# Patient Record
Sex: Male | Born: 1955 | Race: White | Hispanic: No | Marital: Married | Smoking: Current every day smoker
Health system: Southern US, Community
[De-identification: ages and names within clinical notes are randomized; demographics above are authoritative.]

## PROBLEM LIST (undated history)

## (undated) DIAGNOSIS — I10 Essential (primary) hypertension: Secondary | ICD-10-CM

## (undated) DIAGNOSIS — C801 Malignant (primary) neoplasm, unspecified: Secondary | ICD-10-CM

## (undated) DIAGNOSIS — C679 Malignant neoplasm of bladder, unspecified: Secondary | ICD-10-CM

## (undated) DIAGNOSIS — C61 Malignant neoplasm of prostate: Secondary | ICD-10-CM

## (undated) HISTORY — PX: LIVER TRANSPLANT: SHX410

## (undated) HISTORY — PX: NEPHRECTOMY: SHX65

## (undated) HISTORY — PX: PROSTATE BIOPSY: SHX241

---

## 2009-01-31 ENCOUNTER — Encounter: Admission: RE | Admit: 2009-01-31 | Discharge: 2009-01-31 | Payer: Self-pay | Admitting: Orthopedic Surgery

## 2009-02-16 ENCOUNTER — Encounter (INDEPENDENT_AMBULATORY_CARE_PROVIDER_SITE_OTHER): Payer: Self-pay | Admitting: Orthopedic Surgery

## 2009-02-16 ENCOUNTER — Inpatient Hospital Stay (HOSPITAL_COMMUNITY): Admission: RE | Admit: 2009-02-16 | Discharge: 2009-02-18 | Payer: Self-pay | Admitting: Orthopedic Surgery

## 2009-11-29 ENCOUNTER — Ambulatory Visit (HOSPITAL_COMMUNITY): Admission: RE | Admit: 2009-11-29 | Discharge: 2009-11-29 | Payer: Self-pay | Admitting: Family Medicine

## 2009-12-01 ENCOUNTER — Ambulatory Visit (HOSPITAL_COMMUNITY): Admission: RE | Admit: 2009-12-01 | Discharge: 2009-12-01 | Payer: Self-pay | Admitting: Family Medicine

## 2009-12-24 ENCOUNTER — Emergency Department (HOSPITAL_COMMUNITY): Admission: EM | Admit: 2009-12-24 | Discharge: 2009-12-24 | Payer: Self-pay | Admitting: Emergency Medicine

## 2009-12-26 ENCOUNTER — Inpatient Hospital Stay (HOSPITAL_COMMUNITY): Admission: EM | Admit: 2009-12-26 | Discharge: 2009-12-27 | Payer: Self-pay | Admitting: Emergency Medicine

## 2010-01-18 ENCOUNTER — Ambulatory Visit (HOSPITAL_COMMUNITY): Admission: RE | Admit: 2010-01-18 | Discharge: 2010-01-18 | Payer: Self-pay | Admitting: Family Medicine

## 2010-02-25 ENCOUNTER — Inpatient Hospital Stay (HOSPITAL_COMMUNITY): Admission: EM | Admit: 2010-02-25 | Discharge: 2010-02-27 | Payer: Self-pay | Admitting: Emergency Medicine

## 2010-03-08 ENCOUNTER — Ambulatory Visit (HOSPITAL_COMMUNITY): Admission: RE | Admit: 2010-03-08 | Discharge: 2010-03-08 | Payer: Self-pay | Admitting: Family Medicine

## 2010-03-10 IMAGING — CR DG OR PORTABLE SPINE
1 series · 1 of 1 positions shown · non-contrast
Comparison: 6288 hours

CLINICAL DATA: Spinal stenosis - intraoperative localizing image

PORTABLE SPINE

[view not recorded]
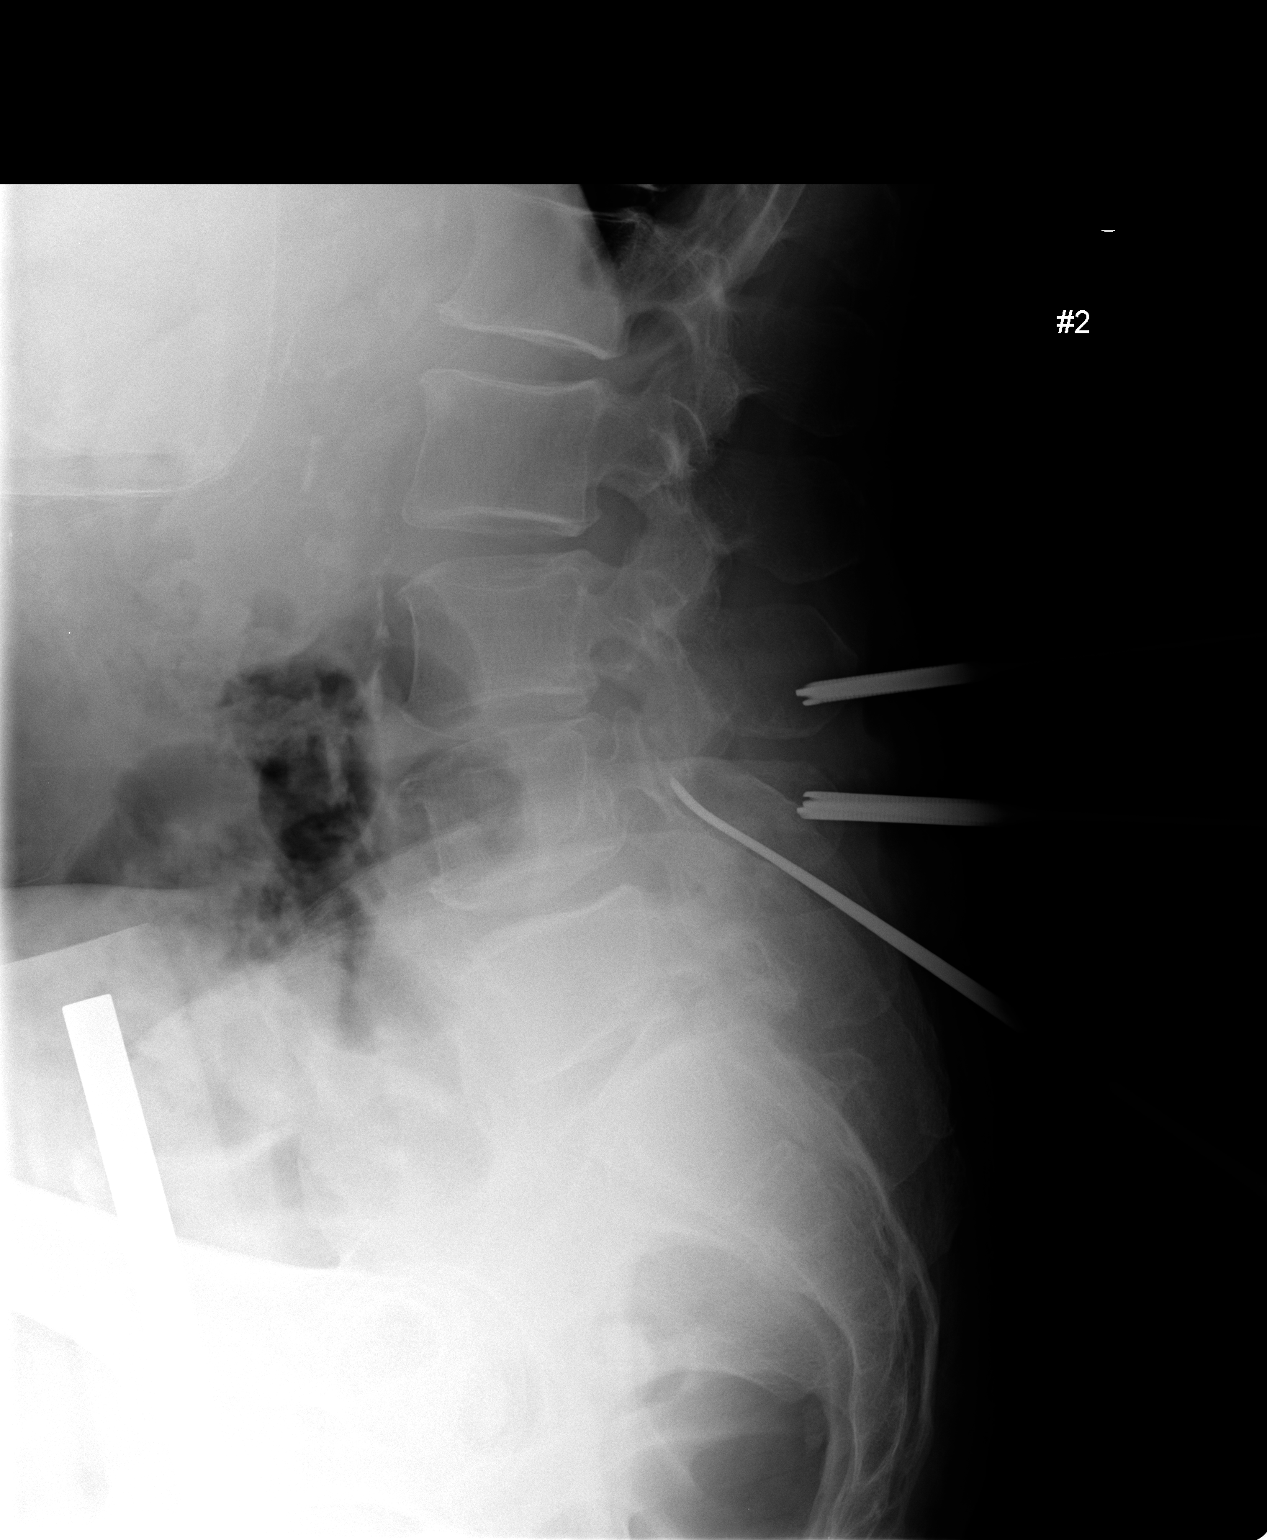

[1 of 1 positions shown; findings below may reference images not displayed]

FINDINGS: Image #2 at 9084 hours, shows an instrument posterior to
the neural canal at the level of the L4 pedicle.
IMPRESSION: An instrument localizes the level of the pedicles of L4.

## 2010-03-10 IMAGING — CR DG OR PORTABLE SPINE
1 series · 1 of 1 positions shown · non-contrast
Comparison: 3822 hours

CLINICAL DATA: Localization for spinal stenosis

PORTABLE SPINE

[view not recorded]
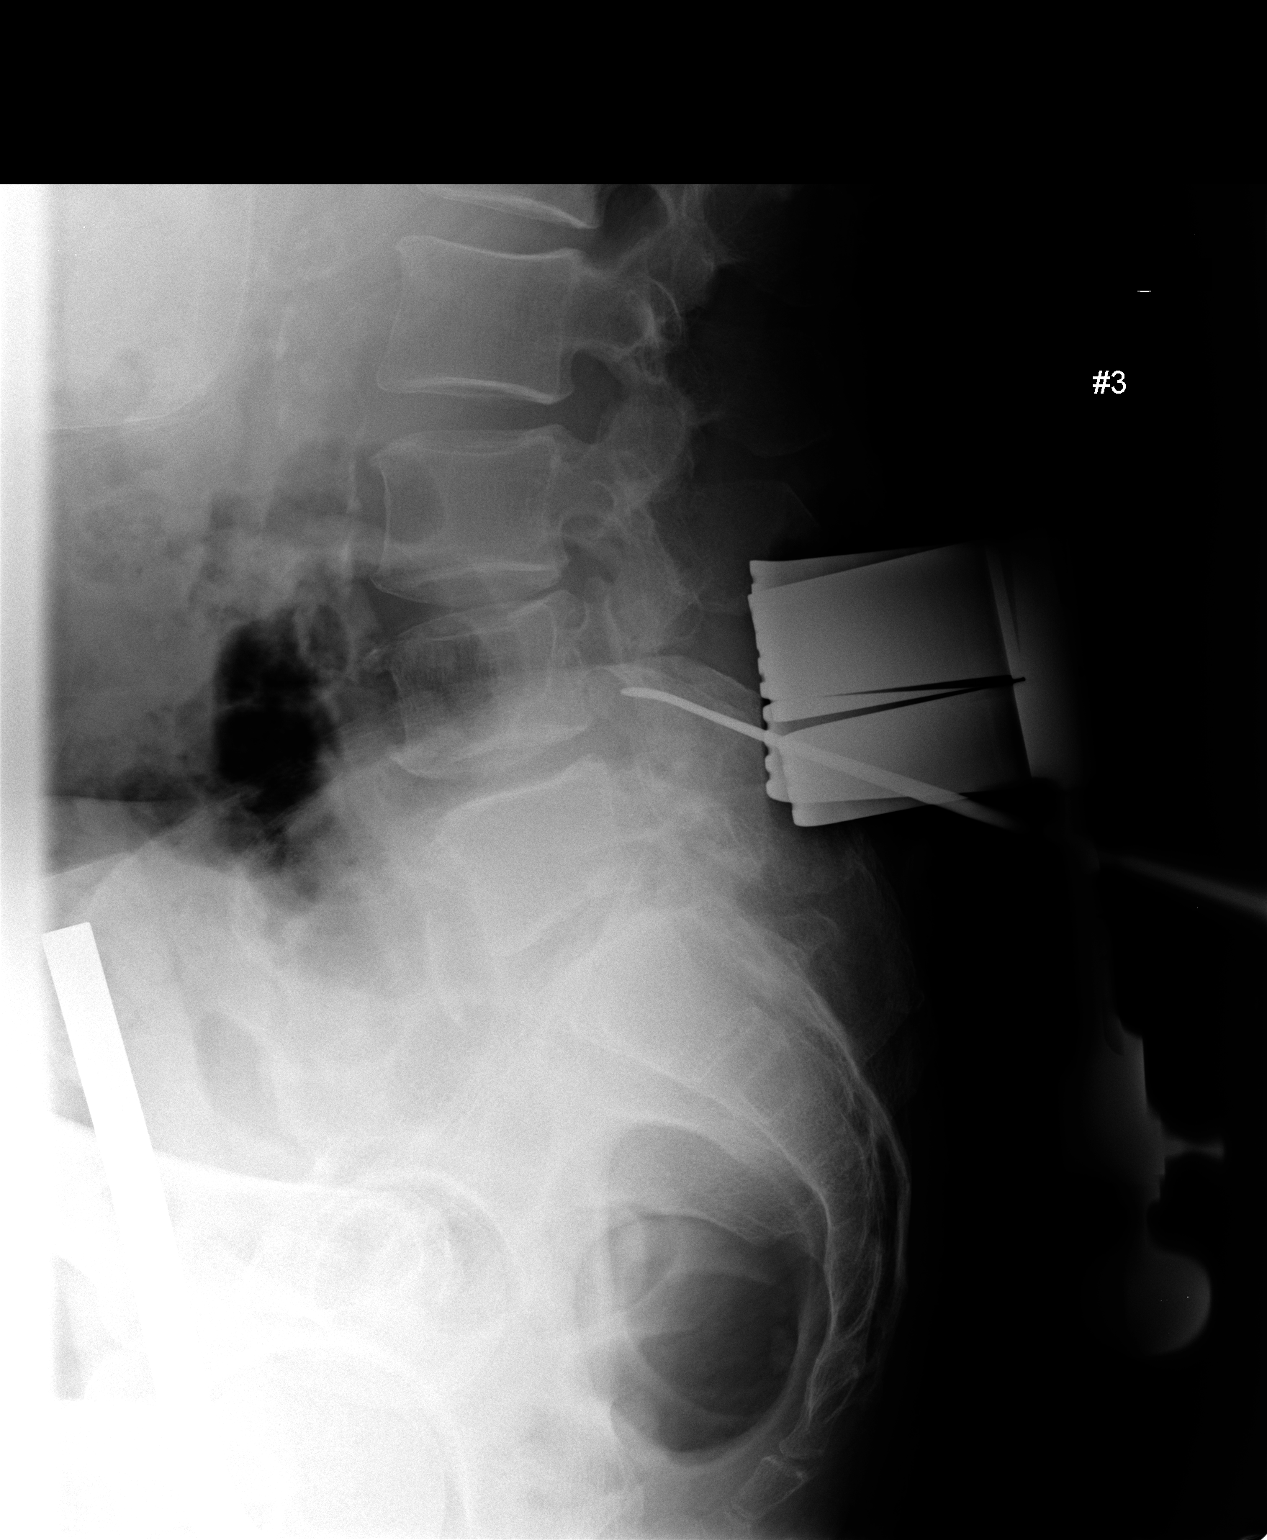

[1 of 1 positions shown; findings below may reference images not displayed]

FINDINGS: Image #3 at 4538 hours, shows the tip of the instrument
along the posterior margin of the neural canal, just above the L4-
L5 disc space.
IMPRESSION: Localizing instrument just above the L4-L5 disc space.

## 2010-03-17 ENCOUNTER — Ambulatory Visit (HOSPITAL_COMMUNITY): Admission: RE | Admit: 2010-03-17 | Discharge: 2010-03-17 | Payer: Self-pay | Admitting: Family Medicine

## 2010-03-28 ENCOUNTER — Inpatient Hospital Stay (HOSPITAL_COMMUNITY): Admission: EM | Admit: 2010-03-28 | Discharge: 2010-03-30 | Payer: Self-pay | Admitting: Emergency Medicine

## 2010-04-03 ENCOUNTER — Ambulatory Visit (HOSPITAL_COMMUNITY): Admission: RE | Admit: 2010-04-03 | Discharge: 2010-04-03 | Payer: Self-pay | Admitting: Family Medicine

## 2010-04-06 ENCOUNTER — Emergency Department (HOSPITAL_COMMUNITY): Admission: EM | Admit: 2010-04-06 | Discharge: 2010-04-06 | Payer: Self-pay | Admitting: Emergency Medicine

## 2010-04-08 ENCOUNTER — Emergency Department (HOSPITAL_COMMUNITY): Admission: EM | Admit: 2010-04-08 | Discharge: 2010-04-08 | Payer: Self-pay | Admitting: Emergency Medicine

## 2010-04-09 ENCOUNTER — Ambulatory Visit (HOSPITAL_COMMUNITY): Admission: RE | Admit: 2010-04-09 | Discharge: 2010-04-09 | Payer: Self-pay | Admitting: Interventional Radiology

## 2010-05-12 ENCOUNTER — Ambulatory Visit (HOSPITAL_COMMUNITY): Admission: RE | Admit: 2010-05-12 | Discharge: 2010-05-12 | Payer: Self-pay | Admitting: Internal Medicine

## 2010-07-23 ENCOUNTER — Encounter: Payer: Self-pay | Admitting: Family Medicine

## 2010-09-12 LAB — BASIC METABOLIC PANEL
Calcium: 8 mg/dL — ABNORMAL LOW (ref 8.4–10.5)
Creatinine, Ser: 1.24 mg/dL (ref 0.4–1.5)
GFR calc Af Amer: >60 mL/min (ref >60)

## 2010-09-14 LAB — PROTEIN, BODY FLUID: Total protein, fluid: <3 g/dL

## 2010-09-14 LAB — BODY FLUID CELL COUNT WITH DIFFERENTIAL
Eos, Fluid: 0 %
Lymphs, Fluid: 17 %
Lymphs, Fluid: 6 %
Monocyte-Macrophage-Serous Fluid: 76 % (ref 50–90)
Monocyte-Macrophage-Serous Fluid: 40 % — ABNORMAL LOW (ref 50–90)
Neutrophil Count, Fluid: 7 % (ref 0–25)
Neutrophil Count, Fluid: 7 % (ref 0–25)
Other Cells, Fluid: 0 %
Total Nucleated Cell Count, Fluid: 111 cu mm (ref 0–1000)

## 2010-09-14 LAB — COMPREHENSIVE METABOLIC PANEL WITH GFR
ALT: 88 U/L — ABNORMAL HIGH (ref 0–53)
Alkaline Phosphatase: 118 U/L — ABNORMAL HIGH (ref 39–117)
BUN: 20 mg/dL (ref 6–23)
CO2: 26 meq/L (ref 19–32)
Calcium: 7.9 mg/dL — ABNORMAL LOW (ref 8.4–10.5)
GFR calc non Af Amer: 52 mL/min — ABNORMAL LOW (ref >60)
Glucose, Bld: 134 mg/dL — ABNORMAL HIGH (ref 70–99)
Sodium: 120 meq/L — ABNORMAL LOW (ref 135–145)
Total Protein: 6.1 g/dL (ref 6.0–8.3)

## 2010-09-14 LAB — DIFFERENTIAL
Basophils Absolute: 0 10*3/uL (ref 0.0–0.1)
Basophils Relative: 0 % (ref 0–1)
Basophils Relative: 0 % (ref 0–1)
Basophils Relative: 1 % (ref 0–1)
Eosinophils Absolute: 0.1 K/uL (ref 0.0–0.7)
Eosinophils Absolute: 0 10*3/uL (ref 0.0–0.7)
Eosinophils Relative: 1 % (ref 0–5)
Eosinophils Relative: 0 % (ref 0–5)
Lymphocytes Relative: 11 % — ABNORMAL LOW (ref 12–46)
Lymphs Abs: 1.1 K/uL (ref 0.7–4.0)
Lymphs Abs: 1.7 10*3/uL (ref 0.7–4.0)
Monocytes Absolute: 1 10*3/uL (ref 0.1–1.0)
Monocytes Absolute: 0.7 10*3/uL (ref 0.1–1.0)
Monocytes Absolute: 0.9 10*3/uL (ref 0.1–1.0)
Monocytes Relative: 10 % (ref 3–12)
Monocytes Relative: 10 % (ref 3–12)
Monocytes Relative: 7 % (ref 3–12)
Neutro Abs: 7.9 K/uL — ABNORMAL HIGH (ref 1.7–7.7)
Neutro Abs: 6.6 10*3/uL (ref 1.7–7.7)
Neutrophils Relative %: 78 % — ABNORMAL HIGH (ref 43–77)
Neutrophils Relative %: 70 % (ref 43–77)

## 2010-09-14 LAB — BASIC METABOLIC PANEL
CO2: 21 mEq/L (ref 19–32)
GFR calc Af Amer: >60 mL/min (ref >60)
GFR calc non Af Amer: 50 mL/min — ABNORMAL LOW (ref >60)
Glucose, Bld: 104 mg/dL — ABNORMAL HIGH (ref 70–99)
Potassium: 4.9 mEq/L (ref 3.5–5.1)
Sodium: 127 mEq/L — ABNORMAL LOW (ref 135–145)

## 2010-09-14 LAB — CBC
HCT: 33.7 % — ABNORMAL LOW (ref 39.0–52.0)
HCT: 33 % — ABNORMAL LOW (ref 39.0–52.0)
HCT: 38.3 % — ABNORMAL LOW (ref 39.0–52.0)
Hemoglobin: 12.8 g/dL — ABNORMAL LOW (ref 13.0–17.0)
Hemoglobin: 11.6 g/dL — ABNORMAL LOW (ref 13.0–17.0)
MCH: 38.1 pg — ABNORMAL HIGH (ref 26.0–34.0)
MCHC: 38 g/dL — ABNORMAL HIGH (ref 30.0–36.0)
MCHC: 35.1 g/dL (ref 30.0–36.0)
MCV: 100.3 fL — ABNORMAL HIGH (ref 78.0–100.0)
Platelets: 120 10*3/uL — ABNORMAL LOW (ref 150–400)
RBC: 3.36 MIL/uL — ABNORMAL LOW (ref 4.22–5.81)
RBC: 3.03 MIL/uL — ABNORMAL LOW (ref 4.22–5.81)
RDW: 15.3 % (ref 11.5–15.5)
RDW: 16.9 % — ABNORMAL HIGH (ref 11.5–15.5)
WBC: 10.2 10*3/uL (ref 4.0–10.5)
WBC: 10 10*3/uL (ref 4.0–10.5)

## 2010-09-14 LAB — URINALYSIS, ROUTINE W REFLEX MICROSCOPIC
Glucose, UA: NEGATIVE mg/dL
Ketones, ur: NEGATIVE mg/dL
Protein, ur: NEGATIVE mg/dL
pH: 7 (ref 5.0–8.0)

## 2010-09-14 LAB — COMPREHENSIVE METABOLIC PANEL
ALT: 110 U/L — ABNORMAL HIGH (ref 0–53)
ALT: 78 U/L — ABNORMAL HIGH (ref 0–53)
AST: 128 U/L — ABNORMAL HIGH (ref 0–37)
AST: 110 U/L — ABNORMAL HIGH (ref 0–37)
AST: 132 U/L — ABNORMAL HIGH (ref 0–37)
Albumin: 1.9 g/dL — ABNORMAL LOW (ref 3.5–5.2)
Albumin: 2.5 g/dL — ABNORMAL LOW (ref 3.5–5.2)
Alkaline Phosphatase: 131 U/L — ABNORMAL HIGH (ref 39–117)
Alkaline Phosphatase: 99 U/L (ref 39–117)
CO2: 24 mEq/L (ref 19–32)
Calcium: 7.7 mg/dL — ABNORMAL LOW (ref 8.4–10.5)
Chloride: 90 mEq/L — ABNORMAL LOW (ref 96–112)
Chloride: 99 mEq/L (ref 96–112)
Creatinine, Ser: 1.42 mg/dL (ref 0.4–1.5)
GFR calc Af Amer: >60 mL/min (ref >60)
GFR calc Af Amer: 56 mL/min — ABNORMAL LOW (ref >60)
GFR calc Af Amer: >60 mL/min (ref >60)
GFR calc non Af Amer: 51 mL/min — ABNORMAL LOW (ref >60)
Glucose, Bld: 115 mg/dL — ABNORMAL HIGH (ref 70–99)
Glucose, Bld: 143 mg/dL — ABNORMAL HIGH (ref 70–99)
Potassium: 4.8 mEq/L (ref 3.5–5.1)
Potassium: 4.5 mEq/L (ref 3.5–5.1)
Potassium: 5 mEq/L (ref 3.5–5.1)
Sodium: 126 mEq/L — ABNORMAL LOW (ref 135–145)
Sodium: 128 mEq/L — ABNORMAL LOW (ref 135–145)
Total Bilirubin: 5.3 mg/dL — ABNORMAL HIGH (ref 0.3–1.2)
Total Bilirubin: 4 mg/dL — ABNORMAL HIGH (ref 0.3–1.2)
Total Protein: 7.5 g/dL (ref 6.0–8.3)

## 2010-09-14 LAB — URINE CULTURE
Colony Count: NO GROWTH
Culture  Setup Time: 201109271845

## 2010-09-14 LAB — BODY FLUID CULTURE
Culture: NO GROWTH
Gram Stain: NONE SEEN

## 2010-09-14 LAB — GRAM STAIN: Gram Stain: NONE SEEN

## 2010-09-14 LAB — ALBUMIN, FLUID (OTHER): Albumin, Fluid: <1 g/dL

## 2010-09-14 LAB — PATHOLOGIST SMEAR REVIEW

## 2010-09-14 LAB — PROTIME-INR
INR: 1.33 (ref 0.00–1.49)
INR: 1.36 (ref 0.00–1.49)
Prothrombin Time: 16.7 s — ABNORMAL HIGH (ref 11.6–15.2)

## 2010-09-14 LAB — LACTATE DEHYDROGENASE, PLEURAL OR PERITONEAL FLUID: LD, Fluid: 24 U/L — ABNORMAL HIGH (ref 3–23)

## 2010-09-14 LAB — URINE MICROSCOPIC-ADD ON

## 2010-09-14 LAB — APTT: aPTT: 36 s (ref 24–37)

## 2010-09-14 LAB — AMMONIA: Ammonia: 23 umol/L (ref 11–35)

## 2010-09-15 LAB — APTT: aPTT: 35 seconds (ref 24–37)

## 2010-09-15 LAB — DIFFERENTIAL
Eosinophils Absolute: 0.2 10*3/uL (ref 0.0–0.7)
Eosinophils Relative: 1 % (ref 0–5)
Lymphocytes Relative: 7 % — ABNORMAL LOW (ref 12–46)
Lymphs Abs: 1 10*3/uL (ref 0.7–4.0)
Monocytes Absolute: 1.1 10*3/uL — ABNORMAL HIGH (ref 0.1–1.0)
Monocytes Relative: 8 % (ref 3–12)

## 2010-09-15 LAB — CULTURE, BLOOD (ROUTINE X 2)
Culture: NO GROWTH
Culture: NO GROWTH

## 2010-09-15 LAB — CBC
HCT: 40.6 % (ref 39.0–52.0)
Hemoglobin: 10.7 g/dL — ABNORMAL LOW (ref 13.0–17.0)
MCH: 36.1 pg — ABNORMAL HIGH (ref 26.0–34.0)
MCH: 36.7 pg — ABNORMAL HIGH (ref 26.0–34.0)
MCV: 97 fL (ref 78.0–100.0)
MCV: 99.3 fL (ref 78.0–100.0)
Platelets: 122 10*3/uL — ABNORMAL LOW (ref 150–400)
Platelets: 99 10*3/uL — ABNORMAL LOW (ref 150–400)
RBC: 4.09 MIL/uL — ABNORMAL LOW (ref 4.22–5.81)
RDW: 17.1 % — ABNORMAL HIGH (ref 11.5–15.5)
WBC: 7.1 10*3/uL (ref 4.0–10.5)

## 2010-09-15 LAB — HEPATIC FUNCTION PANEL
ALT: 118 U/L — ABNORMAL HIGH (ref 0–53)
AST: 164 U/L — ABNORMAL HIGH (ref 0–37)
Alkaline Phosphatase: 144 U/L — ABNORMAL HIGH (ref 39–117)
Bilirubin, Direct: 2.4 mg/dL — ABNORMAL HIGH (ref 0.0–0.3)
Indirect Bilirubin: 3 mg/dL — ABNORMAL HIGH (ref 0.3–0.9)
Total Bilirubin: 5.4 mg/dL — ABNORMAL HIGH (ref 0.3–1.2)

## 2010-09-15 LAB — BASIC METABOLIC PANEL
BUN: 27 mg/dL — ABNORMAL HIGH (ref 6–23)
CO2: 18 mEq/L — ABNORMAL LOW (ref 19–32)
Chloride: 101 mEq/L (ref 96–112)
Chloride: 96 mEq/L (ref 96–112)
Creatinine, Ser: 1.29 mg/dL (ref 0.4–1.5)
GFR calc Af Amer: >60 mL/min (ref >60)
GFR calc non Af Amer: >60 mL/min (ref >60)
Glucose, Bld: 118 mg/dL — ABNORMAL HIGH (ref 70–99)
Potassium: 3.9 mEq/L (ref 3.5–5.1)
Potassium: 4.5 mEq/L (ref 3.5–5.1)
Sodium: 127 mEq/L — ABNORMAL LOW (ref 135–145)

## 2010-09-15 LAB — URINALYSIS, ROUTINE W REFLEX MICROSCOPIC
Bilirubin Urine: NEGATIVE
Glucose, UA: NEGATIVE mg/dL
Hgb urine dipstick: NEGATIVE
Ketones, ur: NEGATIVE mg/dL
Protein, ur: NEGATIVE mg/dL
pH: 6.5 (ref 5.0–8.0)

## 2010-09-15 LAB — POCT I-STAT 3, ART BLOOD GAS (G3+)
Acid-base deficit: 1 mmol/L (ref 0.0–2.0)
O2 Saturation: 96 %
pCO2 arterial: 29.7 mmHg — ABNORMAL LOW (ref 35.0–45.0)
pO2, Arterial: 77 mmHg — ABNORMAL LOW (ref 80.0–100.0)

## 2010-09-15 LAB — PROTIME-INR
INR: 1.32 (ref 0.00–1.49)
INR: 1.44 (ref 0.00–1.49)

## 2010-09-15 LAB — POCT CARDIAC MARKERS
CKMB, poc: 2.8 ng/mL (ref 1.0–8.0)
Troponin i, poc: <0.05 ng/mL (ref 0.00–0.09)

## 2010-09-15 LAB — COMPREHENSIVE METABOLIC PANEL
AST: 136 U/L — ABNORMAL HIGH (ref 0–37)
Albumin: 2 g/dL — ABNORMAL LOW (ref 3.5–5.2)
BUN: 28 mg/dL — ABNORMAL HIGH (ref 6–23)
Calcium: 8.2 mg/dL — ABNORMAL LOW (ref 8.4–10.5)
Creatinine, Ser: 1.47 mg/dL (ref 0.4–1.5)
GFR calc Af Amer: >60 mL/min (ref >60)
Total Protein: 6.6 g/dL (ref 6.0–8.3)

## 2010-09-17 LAB — BASIC METABOLIC PANEL
BUN: 19 mg/dL (ref 6–23)
CO2: 27 mEq/L (ref 19–32)
Chloride: 100 mEq/L (ref 96–112)
Creatinine, Ser: 1.52 mg/dL — ABNORMAL HIGH (ref 0.4–1.5)
Glucose, Bld: 116 mg/dL — ABNORMAL HIGH (ref 70–99)

## 2010-09-17 LAB — AMMONIA: Ammonia: 97 umol/L — ABNORMAL HIGH (ref 11–35)

## 2010-09-17 LAB — COMPREHENSIVE METABOLIC PANEL
ALT: 87 U/L — ABNORMAL HIGH (ref 0–53)
AST: 124 U/L — ABNORMAL HIGH (ref 0–37)
AST: 141 U/L — ABNORMAL HIGH (ref 0–37)
BUN: 22 mg/dL (ref 6–23)
CO2: 19 mEq/L (ref 19–32)
CO2: 26 mEq/L (ref 19–32)
Calcium: 8.7 mg/dL (ref 8.4–10.5)
Chloride: 105 mEq/L (ref 96–112)
Chloride: 99 mEq/L (ref 96–112)
Creatinine, Ser: 1.31 mg/dL (ref 0.4–1.5)
Creatinine, Ser: 1.42 mg/dL (ref 0.4–1.5)
GFR calc Af Amer: >60 mL/min (ref >60)
GFR calc Af Amer: >60 mL/min (ref >60)
GFR calc non Af Amer: 52 mL/min — ABNORMAL LOW (ref >60)
GFR calc non Af Amer: 57 mL/min — ABNORMAL LOW (ref >60)
Glucose, Bld: 172 mg/dL — ABNORMAL HIGH (ref 70–99)
Glucose, Bld: 200 mg/dL — ABNORMAL HIGH (ref 70–99)
Total Bilirubin: 3.1 mg/dL — ABNORMAL HIGH (ref 0.3–1.2)
Total Bilirubin: 3.4 mg/dL — ABNORMAL HIGH (ref 0.3–1.2)

## 2010-09-17 LAB — URINALYSIS, ROUTINE W REFLEX MICROSCOPIC
Bilirubin Urine: NEGATIVE
Glucose, UA: NEGATIVE mg/dL
Hgb urine dipstick: NEGATIVE
Hgb urine dipstick: NEGATIVE
Ketones, ur: NEGATIVE mg/dL
Ketones, ur: NEGATIVE mg/dL
Nitrite: NEGATIVE
Protein, ur: NEGATIVE mg/dL
Specific Gravity, Urine: 1.015 (ref 1.005–1.030)
Urobilinogen, UA: 0.2 mg/dL (ref 0.0–1.0)
Urobilinogen, UA: 0.2 mg/dL (ref 0.0–1.0)

## 2010-09-17 LAB — PROTIME-INR
INR: 1.35 (ref 0.00–1.49)
Prothrombin Time: 16.6 seconds — ABNORMAL HIGH (ref 11.6–15.2)

## 2010-09-17 LAB — DIFFERENTIAL
Basophils Absolute: 0 10*3/uL (ref 0.0–0.1)
Eosinophils Relative: 1 % (ref 0–5)
Lymphocytes Relative: 12 % (ref 12–46)
Lymphs Abs: 0.9 10*3/uL (ref 0.7–4.0)
Neutro Abs: 5.8 10*3/uL (ref 1.7–7.7)
Neutrophils Relative %: 79 % — ABNORMAL HIGH (ref 43–77)

## 2010-09-17 LAB — CBC
Hemoglobin: 14.8 g/dL (ref 13.0–17.0)
MCH: 37.1 pg — ABNORMAL HIGH (ref 26.0–34.0)
MCH: 37.5 pg — ABNORMAL HIGH (ref 26.0–34.0)
MCHC: 34.5 g/dL (ref 30.0–36.0)
MCHC: 34.8 g/dL (ref 30.0–36.0)
MCV: 107.6 fL — ABNORMAL HIGH (ref 78.0–100.0)
MCV: 107.8 fL — ABNORMAL HIGH (ref 78.0–100.0)
Platelets: 108 10*3/uL — ABNORMAL LOW (ref 150–400)
RBC: 3.95 MIL/uL — ABNORMAL LOW (ref 4.22–5.81)
RDW: 16 % — ABNORMAL HIGH (ref 11.5–15.5)

## 2010-09-17 LAB — APTT: aPTT: 34 seconds (ref 24–37)

## 2010-09-17 LAB — LIPASE, BLOOD
Lipase: 57 U/L (ref 11–59)
Lipase: 63 U/L — ABNORMAL HIGH (ref 11–59)

## 2010-09-18 LAB — BODY FLUID CELL COUNT WITH DIFFERENTIAL
Neutrophil Count, Fluid: 31 % — ABNORMAL HIGH (ref 0–25)
Other Cells, Fluid: 0 %
Total Nucleated Cell Count, Fluid: 400 cu mm (ref 0–1000)

## 2010-09-18 LAB — PATHOLOGIST SMEAR REVIEW

## 2010-09-18 LAB — BODY FLUID CULTURE

## 2010-09-18 LAB — ALBUMIN, FLUID (OTHER): Albumin, Fluid: <1 g/dL

## 2010-10-07 LAB — URINALYSIS, ROUTINE W REFLEX MICROSCOPIC
Bilirubin Urine: NEGATIVE
Glucose, UA: NEGATIVE mg/dL
Hgb urine dipstick: NEGATIVE
Ketones, ur: NEGATIVE mg/dL
Protein, ur: NEGATIVE mg/dL
pH: 7 (ref 5.0–8.0)

## 2010-10-07 LAB — COMPREHENSIVE METABOLIC PANEL
Albumin: 2.3 g/dL — ABNORMAL LOW (ref 3.5–5.2)
Alkaline Phosphatase: 73 U/L (ref 39–117)
BUN: 10 mg/dL (ref 6–23)
BUN: 10 mg/dL (ref 6–23)
CO2: 27 mEq/L (ref 19–32)
CO2: 28 mEq/L (ref 19–32)
Calcium: 9 mg/dL (ref 8.4–10.5)
Chloride: 96 mEq/L (ref 96–112)
Chloride: 97 mEq/L (ref 96–112)
Creatinine, Ser: 0.65 mg/dL (ref 0.4–1.5)
GFR calc Af Amer: >60 mL/min (ref >60)
GFR calc non Af Amer: >60 mL/min (ref >60)
GFR calc non Af Amer: >60 mL/min (ref >60)
Potassium: 3.8 mEq/L (ref 3.5–5.1)
Total Bilirubin: 1.6 mg/dL — ABNORMAL HIGH (ref 0.3–1.2)
Total Bilirubin: 2 mg/dL — ABNORMAL HIGH (ref 0.3–1.2)

## 2010-10-07 LAB — DIFFERENTIAL
Basophils Absolute: 0 10*3/uL (ref 0.0–0.1)
Lymphocytes Relative: 24 % (ref 12–46)
Lymphs Abs: 1.7 10*3/uL (ref 0.7–4.0)
Neutro Abs: 4.5 10*3/uL (ref 1.7–7.7)
Neutrophils Relative %: 65 % (ref 43–77)

## 2010-10-07 LAB — TYPE AND SCREEN
ABO/RH(D): A POS
Antibody Screen: NEGATIVE

## 2010-10-07 LAB — CBC
HCT: 42.4 % (ref 39.0–52.0)
MCHC: 35 g/dL (ref 30.0–36.0)
MCV: 101.7 fL — ABNORMAL HIGH (ref 78.0–100.0)
RBC: 4.17 MIL/uL — ABNORMAL LOW (ref 4.22–5.81)
WBC: 7 10*3/uL (ref 4.0–10.5)

## 2010-10-07 LAB — PROTIME-INR
INR: 1 (ref 0.00–1.49)
Prothrombin Time: 13.5 seconds (ref 11.6–15.2)

## 2010-10-07 LAB — APTT: aPTT: 33 seconds (ref 24–37)

## 2010-11-14 NOTE — H&P (Signed)
Cameron Lyons, Cameron Lyons            ACCOUNT NO.:  [id number]   MEDICAL RECORD NO.:  [id number]          PATIENT TYPE:  INP   LOCATION:                               FACILITY:  [facility]   PHYSICIAN:  Georges Lynch. Gioffre, M.D.DATE OF BIRTH:  [birth year]   DATE OF ADMISSION:  02/16/2009  DATE OF DISCHARGE:                              HISTORY & PHYSICAL   CHIEF COMPLAINT:  Low back pain with right foot drop.   PRESENT ILLNESS:  Cameron Lyons is a 55 year old male who has been seen  by Dr. Darrelyn Hillock for worsening pain in his lower back that is associated  with right leg weakness and right foot drop.  Dr. Darrelyn Hillock sent the  patient for a CT myelogram which revealed severe lateral stenosis with  depression of the L5 nerve root.  The patient now presents for central  decompression at L4-L5 with a microdiskectomy.   ALLERGIES:  CODEINE WHICH CAUSES THE PATIENT TO ITCH.  Note:  The  patient states that he is able to take Vicodin and Percocet without any  difficulties.  The patient denies any food, latex or metal allergies.   CURRENT MEDICATIONS:  1. Lisinopril/hydrochlorothiazide 20/25 mg once daily.  2. Hydromorphone 2 mg 1 tablet p.o. every 4-6 hours p.r.n. pain.   CURRENT MEDICAL HISTORY:  1. Hypertension.  2. Dentures, top only.   PATIENT'S PRIMARY CARE PHYSICIAN:  Is located at [facility].  The patient states he does not see one particular physician  in the group.   REVIEW OF SYSTEMS:  GENERAL:  The patient denies fevers, chills or  weight change.  HEENT/NEURO:  Patient admits balance problems.  Suspect  that this is due to his foot drop and weakness on the right.  The  patient denies any dizziness.  The patient denies blurred vision.  The  patient does again have dentures on the top only.  RESPIRATORY:  The  patient denies shortness of breath.  The patient has occasional cough.  CARDIOVASCULAR:  Negative for palpitations, chest pain.  GASTROINTESTINAL:  Negative for  nausea, vomiting or diarrhea.  The  patient denies any blood in his stool.  GENITOURINARY:  Negative for  painful urination or urinating at night.  The patient denies frequent  urinary tract infections.  MUSCULOSKELETAL:  Positive for muscular  weakness in the right leg only as well as back pain and spasms.   PAST SURGICAL HISTORY:  Right hernia repair in 2003.   FAMILY MEDICAL HISTORY:  Father deceased age 55 from pulmonary embolism.  The patient states that his father also had a history of emphysema.  The  patient's mother is living.  She is 24 years old and has no known  medical problems.   SOCIAL HISTORY:  Cameron Lyons is married.  He works for [employer].  The patient states that he is a current smoker.  He smokes approximately  one and a half packs per day and he has done so for the last 35 years.  The patient denies intake of alcohol or use of street drugs.  The  patient states that his wife will  be at home to help take care of him  after the surgery and he does plan to go home after his stay in the  hospital.   PHYSICAL EXAMINATION:  VITAL SIGNS:  Pulse 76, respirations 18, blood  pressure 118/62.  GENERAL: A 55 year old white male alert and oriented x3 in no acute  distress.  He is a good historian.  HEENT:  Unremarkable.  NECK:  Supple, full range of motion.  No signs of lymphadenopathy.  CHEST:  Lungs are clear to auscultation bilaterally without wheezes,  rales or rhonchi.  HEART:  Regular rate and rhythm without murmur.  ABDOMEN:  Soft, nontender.  Bowel sounds positive x4.  EXTREMITIES.  Right lower extremity noted with hip flexion, knee flexion  and dorsiflexion of the right foot.  Dorsalis pedis pulses are 2+  bilaterally.  Sensation is intact in the lower extremities bilaterally.  The patient has no pain with palpation of his lower back.  The patient  states that his pain has increased with forward flexion and extension of  his spine.  SKIN:  Unremarkable.   PERIPHERAL VASCULAR:  Carotid pulses are 2+ bilaterally without bruit.   IMPRESSION:  1. Spinal stenosis at L4-L5 with depression of the L5 root on the      right.  2. Hypertension.   PLAN:  The patient will undergo all routine laboratories and testing  prior to having L4-L5 central decompression on the right with  microdiskectomy by Dr. Darrelyn Hillock at  [facility] on Wednesday,  February 16, 2009.      Rozell Searing, PAC    ______________________________  Georges Lynch Darrelyn Hillock, M.D.    LD/MEDQ  D:  02/14/2009  T:  02/14/2009  Job:  [id number]

## 2010-11-14 NOTE — Op Note (Signed)
[name], Cameron Lyons NO.:  [id number]   MEDICAL RECORD NO.:  [id number]          PATIENT TYPE:  INP   LOCATION:  0004                         FACILITY:  [facility]   PHYSICIAN:  Georges Lynch. Gioffre, M.D.DATE OF BIRTH:  1955/09/06   DATE OF PROCEDURE:  DATE OF DISCHARGE:                               OPERATIVE REPORT   PREOPERATIVE DIAGNOSES:  1. Spinal stenosis at L4-5.  2. Foot drop on the right.  3. 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  4. 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   POSTOPERATIVE DIAGNOSES:  1. Spinal stenosis at L4-5.  2. Foot drop on the right.  3. 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  4. 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   OPERATIONS:  1. A complete decompressive lumbar laminectomy at L4-L5.  2. Excision of a synovial cyst at L4-5 on the right.  3. Exploration of the four root and the five root on the right.  4. Microdiskectomy at L4-5 on the right hip.   PROCEDURE:  Under general anesthesia, routine orthopedic prep and  draping of the lower back was carried out.  He had 1 gram of IV Ancef  preop.  The appropriate timeout was carried out prior to starting the  surgery.  Two needles were placed in the back for localization purposes.  An x-ray was taken.  Once we verified the appropriate space, an incision  was made over the L4-5 interspace and extended proximally and distally.  The muscle was stripped from the lamina and the spinous process  bilaterally.  Another x-ray was taken with two clamps in place and an  instrument in the space.  Once we localized the L4 spinous process, we  inserted the McCullough retractor and went down and excised the spinous  process of L4, a portion of L5 and a portion of L3.  I went down did a  nice decompression.  The microscope was brought in and we removed the  spinous process, as well as the lamina bilaterally.  We preserved the  facet on the left, but we had to do a partial facetectomy on the right  because there was an extremely large synovial cyst that we removed that  literally was indenting the thecal sac on the right.  Once that was  done, we continued to carry out our partial facetectomy until we could  out wide enough to decompress the recess.  We cauterized the lateral  recess veins.  We were able to do foraminotomies for the four root and  five root.  We went down and then identified the dura again under  microscope control and identified a large herniated disk at L4-5.  We  made a cruciate incision into the disk space and did a microdiskectomy  in the usual fashion.  The nerve root now was free where it severely  compressed with the disk, as well as synovial cyst.  We thoroughly  irrigated out the area.  We were able to easily pass the hockey sticks  out the foramina of both roots on the right.  We irrigated out the  area  and then injected 10 mL of FloSeal over the dura and the soft tissue  areas and I loosely applied some thrombin-soaked Gelfoam in the lateral  gutter regions just above the dura.  The wound then was closed in layers  in the usual fashion.  I left a small deep distal and proximal part of  the wound open for drainage purposes.  The subcu was closed with 0  Vicryl, the skin with metal staples and a sterile Neosporin dressing was  applied.  The patient left the operative room in satisfactory condition.   SURGEON:  Dr. Darrelyn Hillock.   ASSISTANT:  Dr. Fayrene Fearing [place].           ______________________________  Georges Lynch. Darrelyn Hillock, M.D.     RAG/MEDQ  D:  02/16/2009  T:  02/17/2009  Job:  [id number]   cc:   Marjory Lies, M.D.  Fax: [phone]

## 2010-11-17 NOTE — Discharge Summary (Signed)
Cameron Lyons, HELFMAN            ACCOUNT NO.:  [id number]   MEDICAL RECORD NO.:  [id number]          PATIENT TYPE:  INP   LOCATION:  1539                         FACILITY:  [facility]   PHYSICIAN:  Georges Lynch. Gioffre, M.D.DATE OF BIRTH:  03/04/1956   DATE OF ADMISSION:  02/16/2009  DATE OF DISCHARGE:  02/18/2009                               DISCHARGE SUMMARY   CHIEF COMPLAINT:  Low back pain with right foot drop.   ADMITTING DIAGNOSES:  1. Spinal stenosis and a herniated lumbar disk at L4-L5.  2. Hypertension.  3. Dentures top only.   DISCHARGE DIAGNOSES:  1. Spinal stenosis and herniated lumbar disk at L4-L5 status post      decompressive lumbar laminectomy and microdiskectomy at L4-L5.  2. Hypertension.  3. Dentures top only.   PROCEDURE:  On February 16, 2009 Dr. Ranee Gosselin with assistant Dr.  Marlowe Kays took Mr. Barkdull to the operating room at [facility] to undergo complete decompressive lumbar laminectomy at  L4-L5  with excision of synovial cyst at L4-L5 on the right as well as  exploration of the #4 root and #5 root on the right and microdiskectomy  at L4-L5 of the right hip.  This was all performed under general  anesthesia.  Specimen of the synovial cyst was sent to the lab, and was  found to be degenerative synovial line fibroadipose tissue with mild  chronic inflammation without any evidence of malignancy.   CONSULTS:  None.   BRIEF HISTORY:  Mr. Graeff is a 55 year old male who has been seen by  Dr. Darrelyn Hillock for worsening pain in his lower back that was associated  with right leg weakness and a right foot drop.  Dr. Darrelyn Hillock sent this  patient for a CT myelogram which revealed severe lateral stenosis with  depression of the L5 nerve root.  The patient now presents for a central  decompression at L4-L5 with microdiskectomy.   LABORATORY DATA:  Patient's preoperative labs revealed normal vital  signs.  A complete blood count reveals white blood  count of 7.0,  hemoglobin of 14.8, hematocrit 42.4, and a platelet count of 110.  The  patient's chemistry panel revealed hypokalemia of 131, elevated glucose  of 170 and elevated liver enzymes with AST being 127 and ALT at 101.  The patient's urinalysis was unremarkable.  The patient's vital signs  remained stable throughout his hospital stay, and patient's general  chemistry panel was rechecked on postoperative day #1 revealing sodium  felt to be a little low at 133, glucose elevated at 113, and liver  enzymes still elevated with AST at 1104 and ALT at 76.   HOSPITAL COURSE:  The patient was admitted to [facility] on  February 16, 2009 where he was taken  to the operating room to have the  above listed procedure.  The patient tolerated the procedure well.  The  patient was later transferred to the recovery room and went to be  orthopedic floor.  Started on PCA and p.o. analgesics for pain control.  Following surgery patient started back on his p.o. medications.  The  patient was also given 1 g of Ancef at the time of surgery.  The patient  then also received 2 more doses of 1 g Ancef q.8 hours over the 24 hours  after the surgery.  On postoperative day #1, the patient was doing quite  well.  The patient's foot drop had strengthened.  Postop day #2, patient  states that he had no pain going down his legs or in his feet.  The  patient only had pain about the incision site.  The patient was seen by  physical therapy on postoperative day #2, and he was able to the  ambulate 460 feet with a walker and without assistance.  The patient was  also able to transfer sit to stand and stand to sit without any  assistance.  The patient was also able to ascend and descend 2 stairs  sideways only using 1 rail without any assistance.  The patient seemed  to be progressing quite well and meeting his physical therapy goals.  The patient was discharged home on postoperative day #2.  The patient's   dressing was changed on postoperative day #2.  The wound appeared well-  healing.  No signs of infection.   DISCHARGE PLAN:  Patient discharged to home on postoperative day #2.   ACTIVITY:  Weightbearing as tolerated.  He needs to use the walker until  follow-up with Dr. Darrelyn Hillock.  The patient is able to shower.  He is to  cover the wound with saran wrap for the first 3 days.  The patient needs  daily dressing changes.   DIET:  Patient has no restrictions.   FOLLOW-UP:  The patient to contact the office at [phone] to schedule  follow-up with Dr. Darrelyn Hillock 2 weeks from the date of surgery.  EKG  reveals possible left atrial enlargement, regular rate and rhythm.  This  was confirmed by Dr. Lady Deutscher.   DISCHARGE MEDICATIONS:  1. Dilaudid 2 mg 1 tab p.o. q.4-6 hours p.r.n. pain.  2. Robaxin 500 mg 1 tab p.o. t.i.d. p.r.n. muscle spasms.  3. Lisinopril/hydrochlorothiazide 20/25 mg 1 tab p.o. in the morning.   DISPOSITION:  Home.   CONDITION ON DISCHARGE:  Greatly improved.      Rozell Searing, PAC    ______________________________  Georges Lynch Darrelyn Hillock, M.D.    LD/MEDQ  D:  02/25/2009  T:  02/25/2009  Job:  [id number]

## 2011-04-08 IMAGING — US US PARACENTESIS
1 series · 10 of 10 positions shown · non-contrast
Comparison: none

CLINICAL DATA: Hepatitis C;  abdominal ascites

ULTRASOUND-GUIDED PARACENTESIS
TECHNIQUE: An ultrasound-guided paracentesis was thoroughly
discussed with the patient and questions answered.  The benefits,
risks, alternatives and complications were also discussed.  The
patient understands and wishes to proceed with the procedure.  A
verbal as well as written consent was obtained. Ultrasound was
performed to localize and mark an adequate pocket of fluid in the
right lower quadrant of the abdomen.  The area was then prepped and
draped in the normal sterile fashion.  1% Lidocaine was used for
local anesthesia.  Under ultrasound guidance a 19-gauge Yueh
catheter was introduced yielding approximately 2.2 liters of yellow
fluid.  The patient tolerated the procedure well and there were no
immediate complications.

[Series 1: us paracentesis · 0.26mm/px · 10 of 10 slices shown]
[im 1/10]
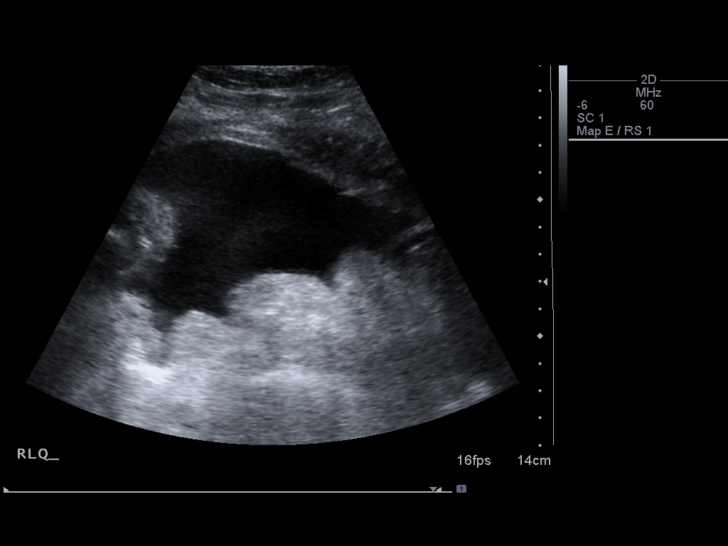
[im 2/10]
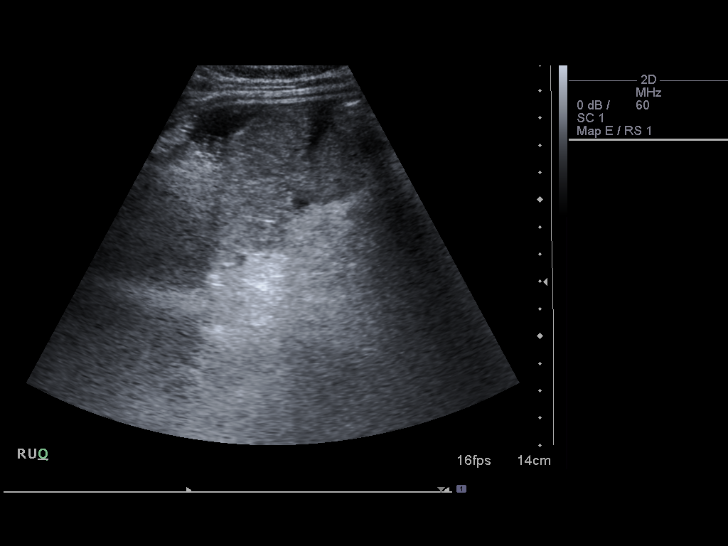
[im 3/10]
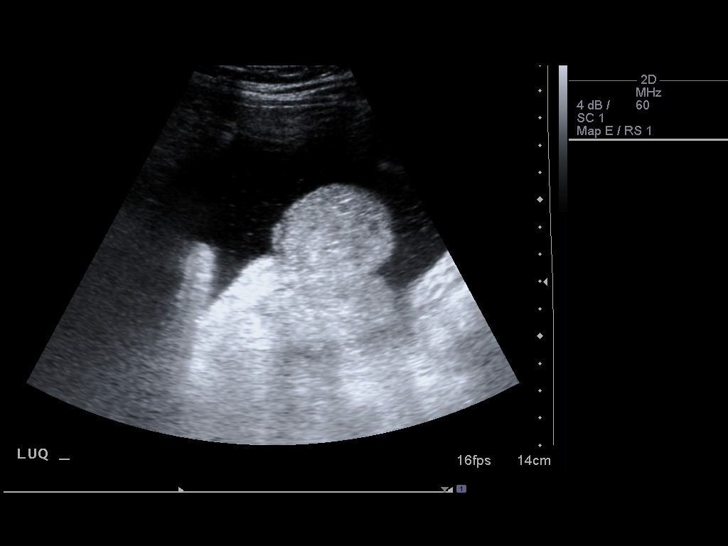
[im 4/10]
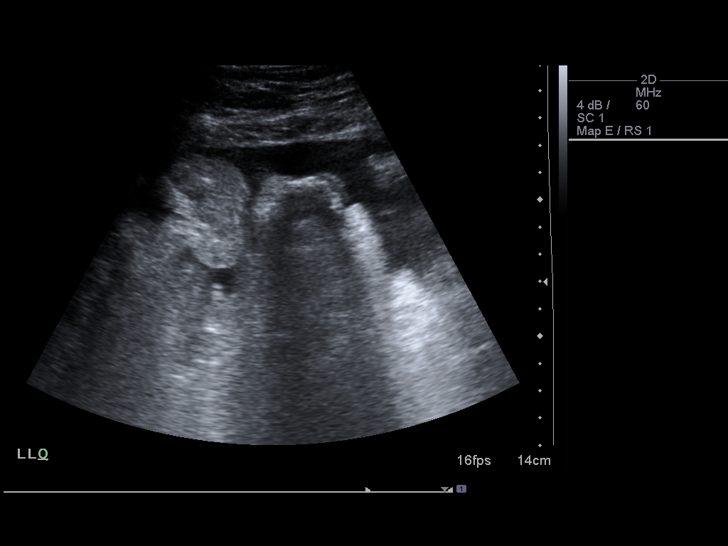
[im 5/10]
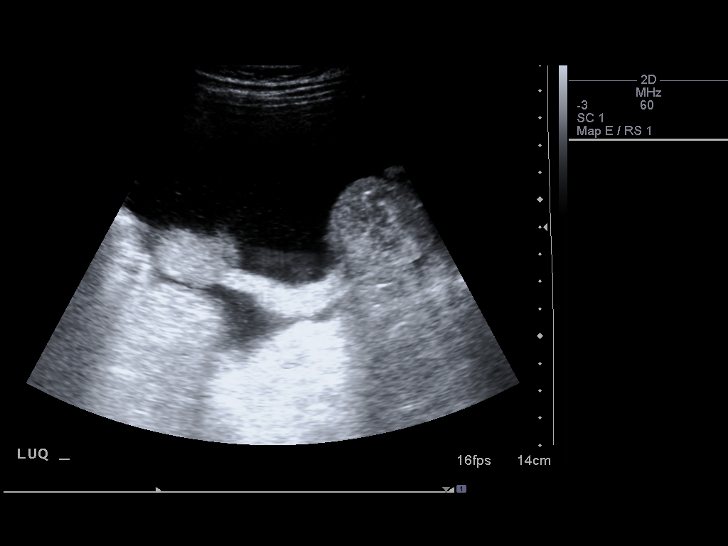
[im 6/10]
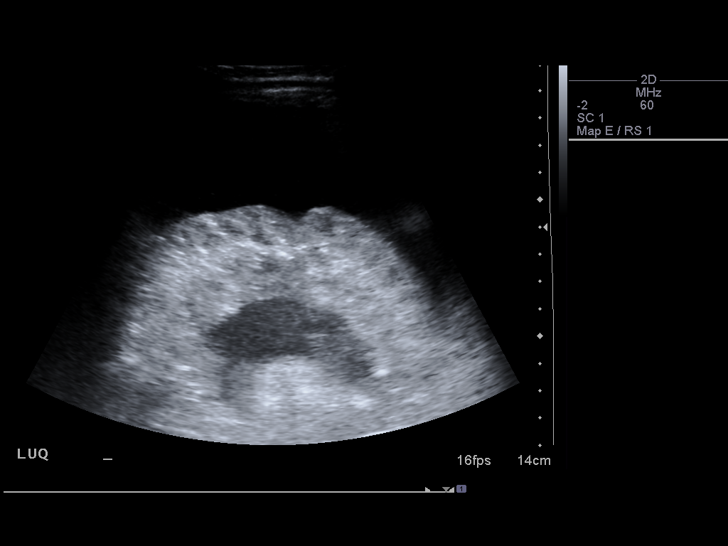
[im 7/10]
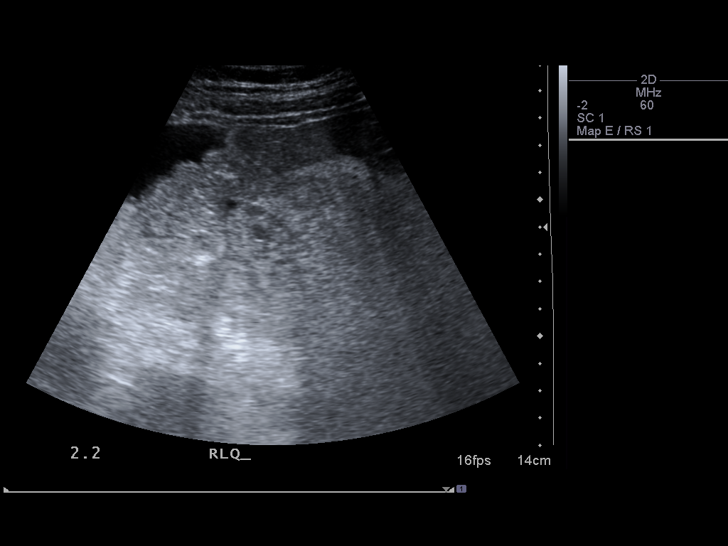
[im 8/10]
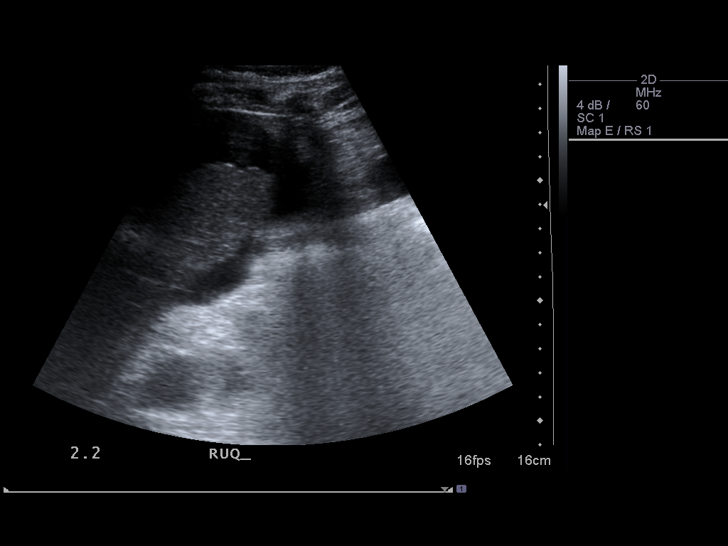
[im 9/10]
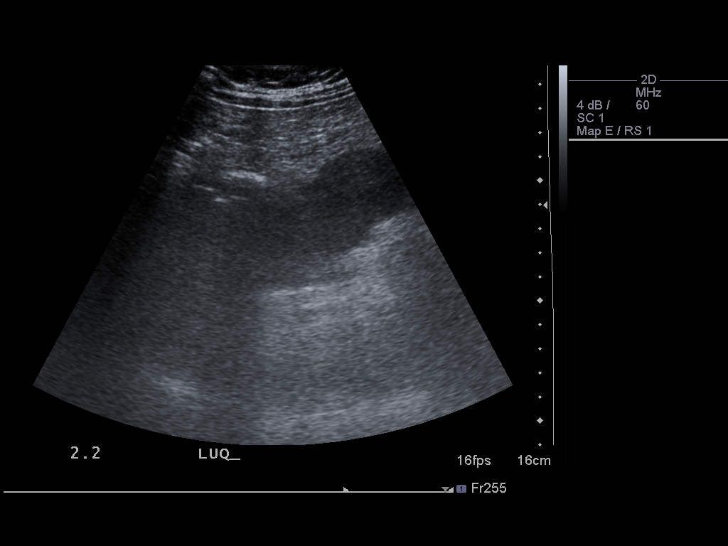
[im 10/10]
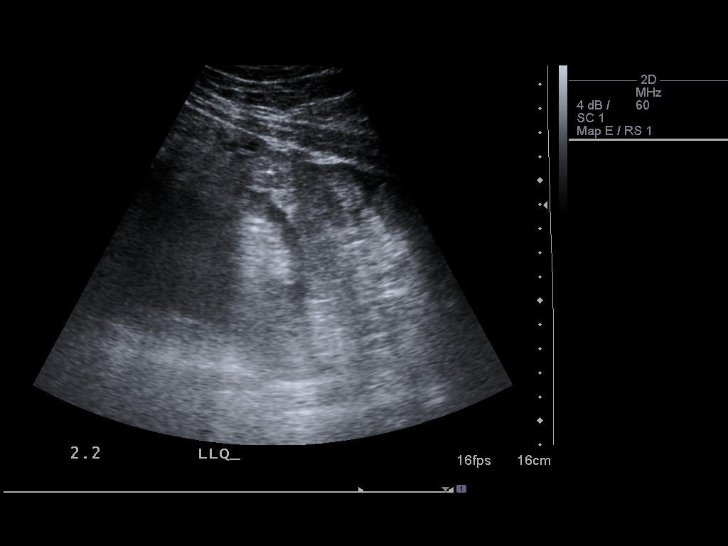

[10 of 10 positions shown; findings below may reference images not displayed]

IMPRESSION: Successful ultrasound-guided paracentesis yielding 2.2 liters of
yellow fluid.

Read by: Yep, Chente.-GREWAL

## 2011-04-21 IMAGING — US US PARACENTESIS
1 series · 9 of 9 positions shown · non-contrast
Comparison: none

CLINICAL DATA: Hepatitis C, cirrhosis, chronic kidney disease,
recurrent ascites; request is made for diagnostic and therapeutic
paracentesis up to 2 liters.

ULTRASOUND-GUIDED DIAGNOSTIC AND THERAPEUTIC PARACENTESIS
TECHNIQUE: An ultrasound-guided paracentesis was thoroughly
discussed with the patient and questions answered.  The benefits,
risks, alternatives and complications were also discussed.  The
patient understands and wishes to proceed with the procedure.  A
verbal as well as written consent was obtained. Ultrasound was
performed to localize and mark an adequate pocket of fluid in the
right mid to lower quadrant of the abdomen.  The area was then
prepped and draped in the normal sterile fashion.  1% Lidocaine was
used for local anesthesia.  Under ultrasound guidance a 19-gauge
Yueh catheter was introduced yielding approximately 2 liters of
straw-colored fluid. A portion of the fluid was submitted to the
laboratory for pre ordered studies.  The patient tolerated the
procedure well and there were no immediate complications.

[Series 1: us paracentesis · 0.30mm/px · 9 of 9 slices shown]
[im 1/9]
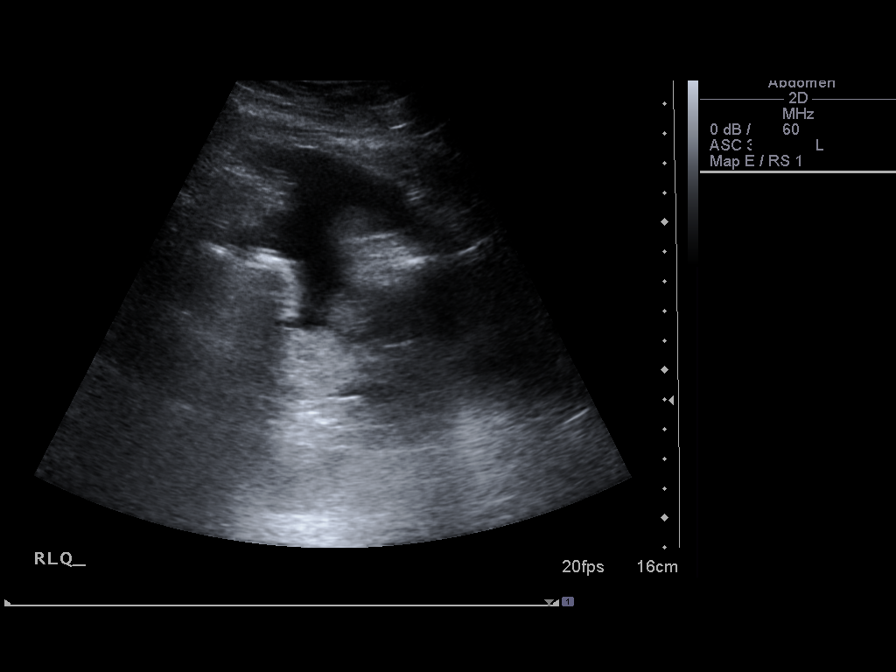
[im 2/9]
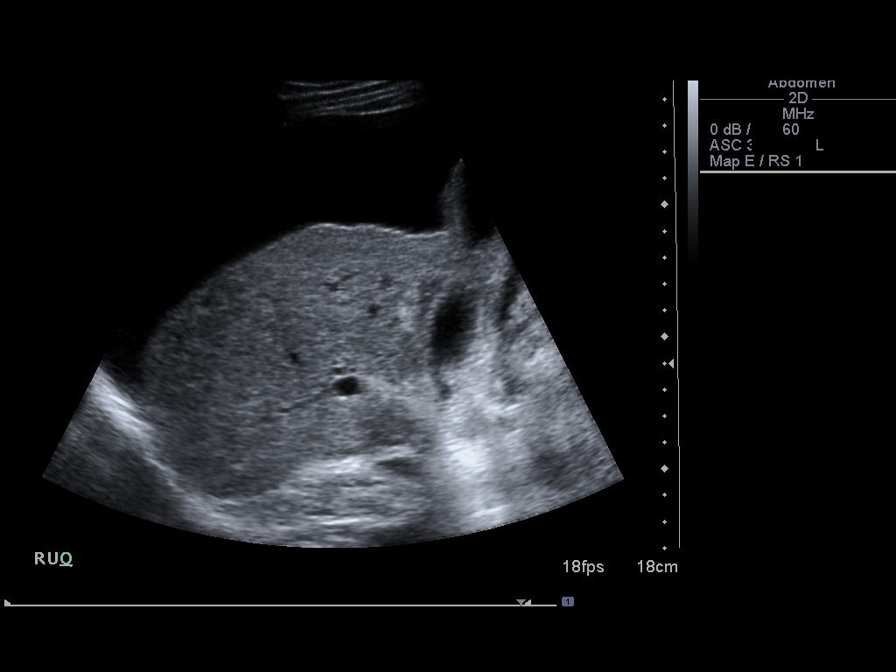
[im 3/9]
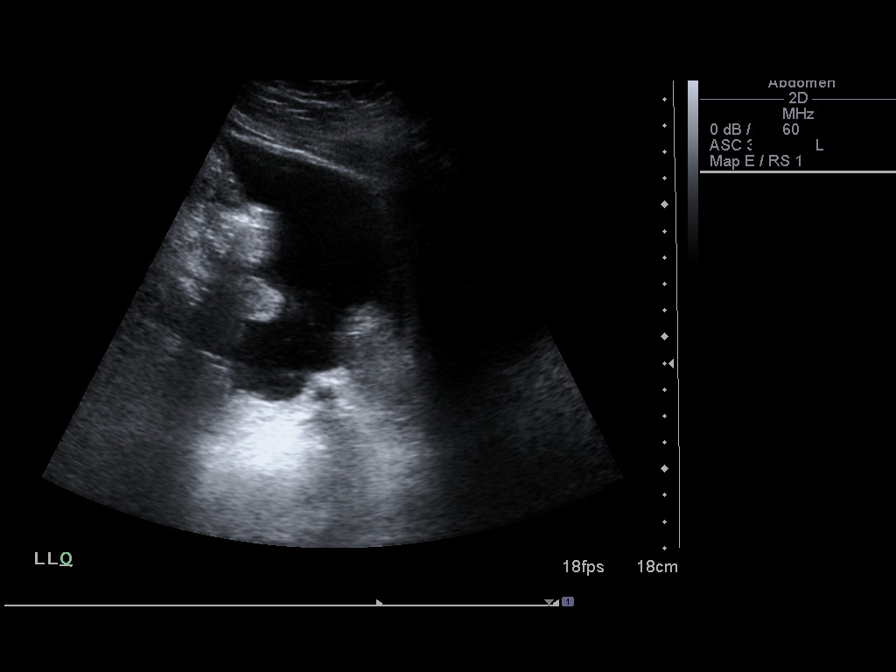
[im 4/9]
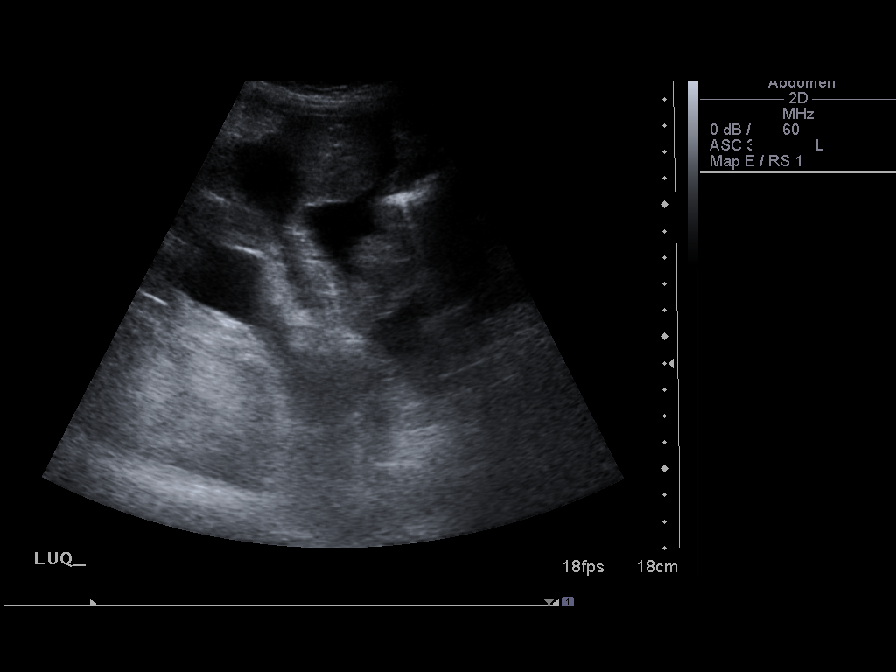
[im 5/9]
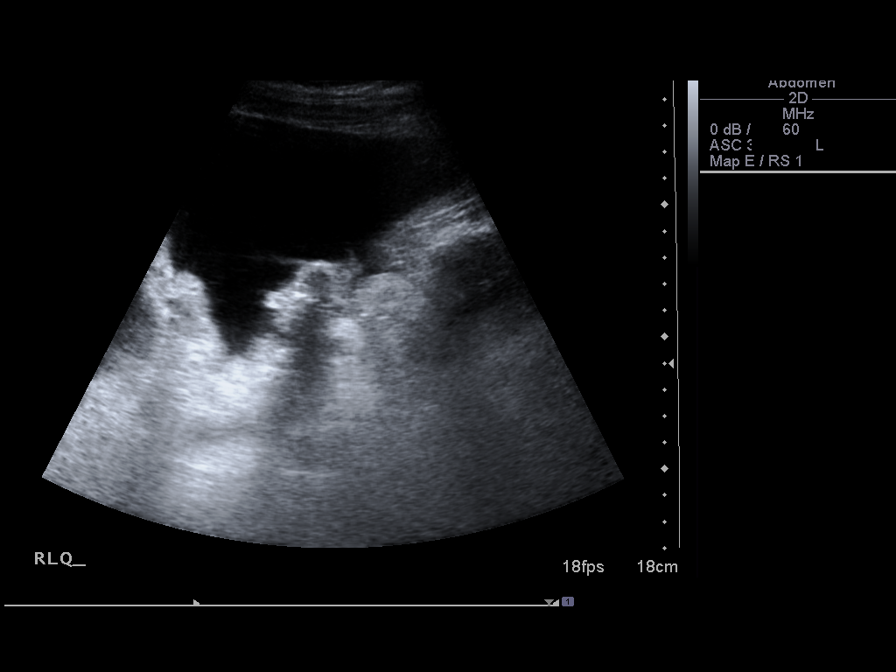
[im 6/9]
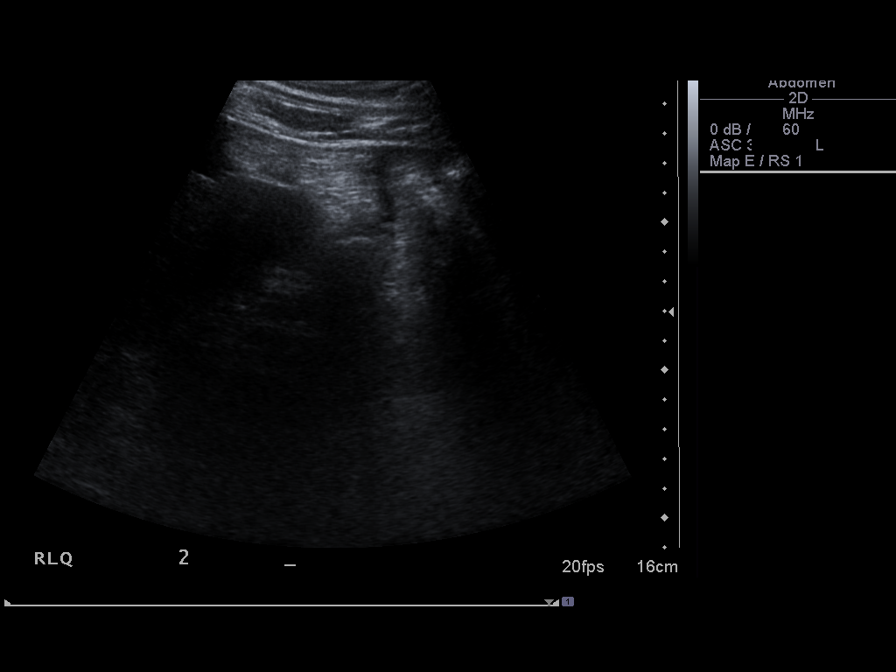
[im 7/9]
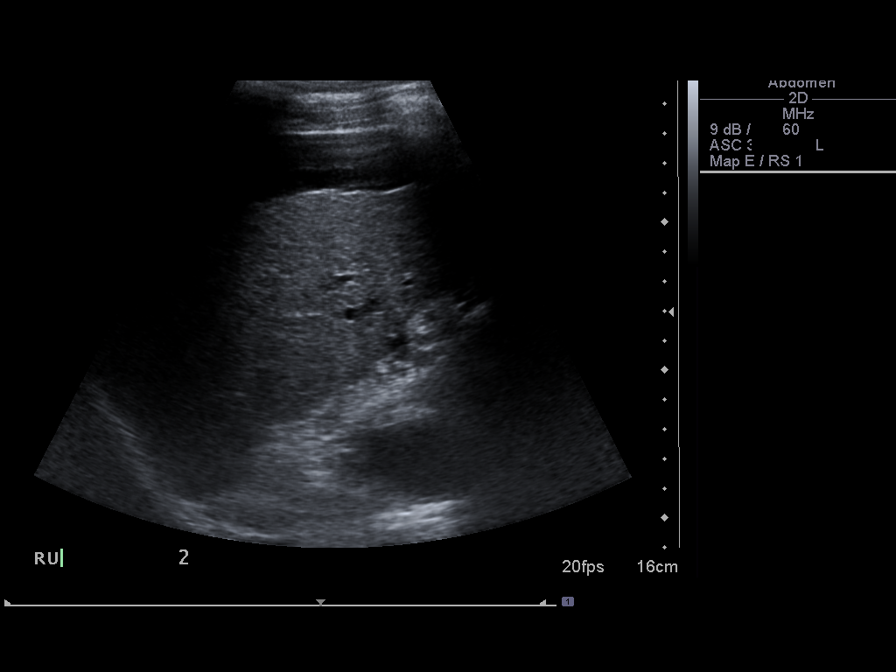
[im 8/9]
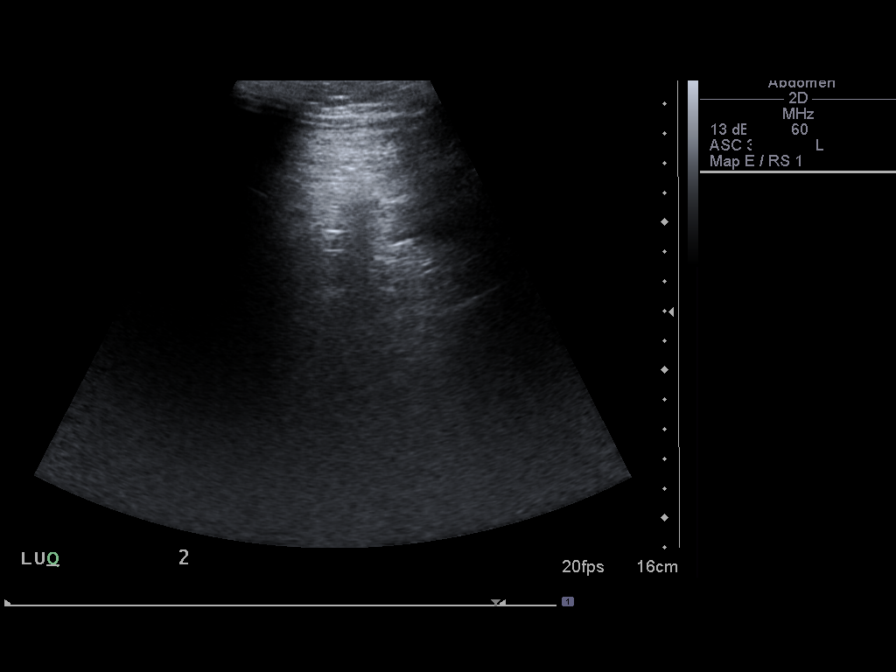
[im 9/9]
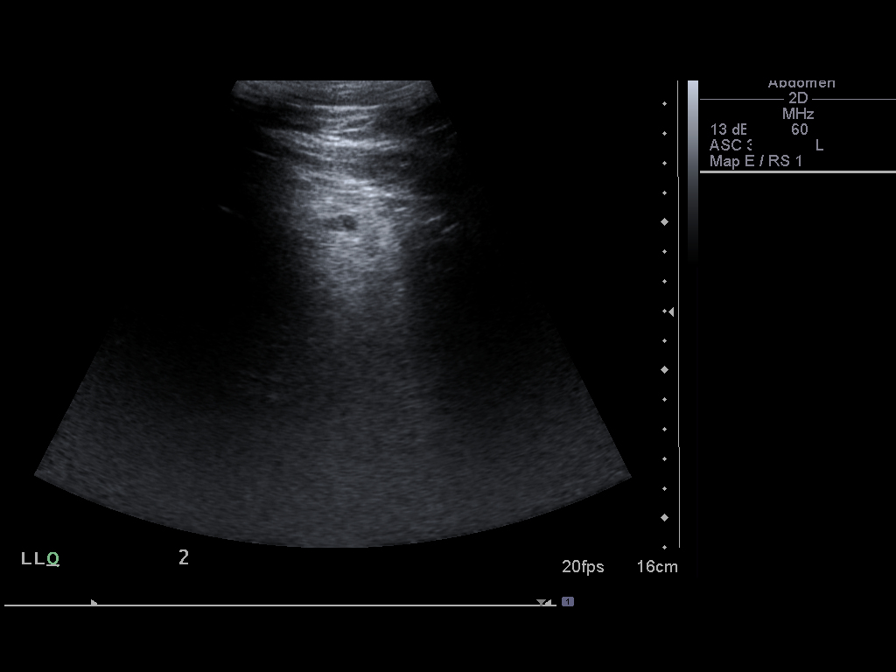

[9 of 9 positions shown; findings below may reference images not displayed]

IMPRESSION: Successful ultrasound-guided diagnostic and therapeutic
paracentesis yielding 2 liters of straw-colored fluid.

Read by: Tiger, Abimelk.-MUGIHARTOYO

## 2011-04-28 IMAGING — US US PARACENTESIS
1 series · 5 of 5 positions shown · non-contrast
Comparison: None

CLINICAL DATA: Cirrhosis; abdominal ascites

ULTRASOUND GUIDED PARACENTESIS

[Series 1: us paracentesis · 0.30mm/px · 5 of 5 slices shown]
[im 1/5]
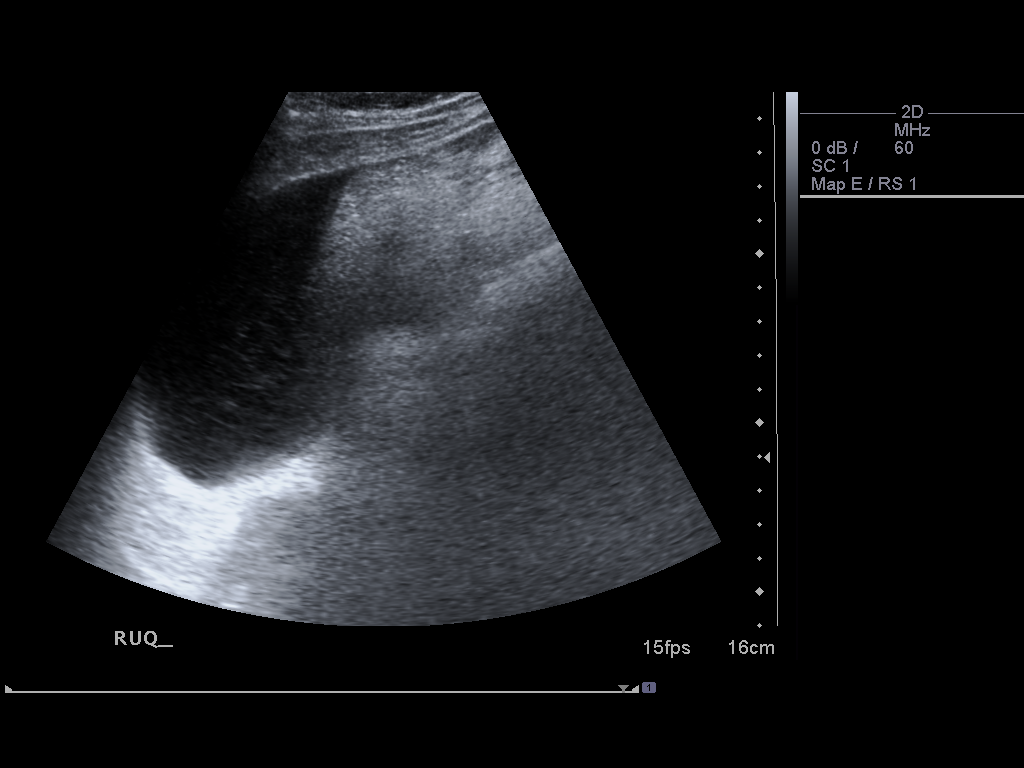
[im 2/5]
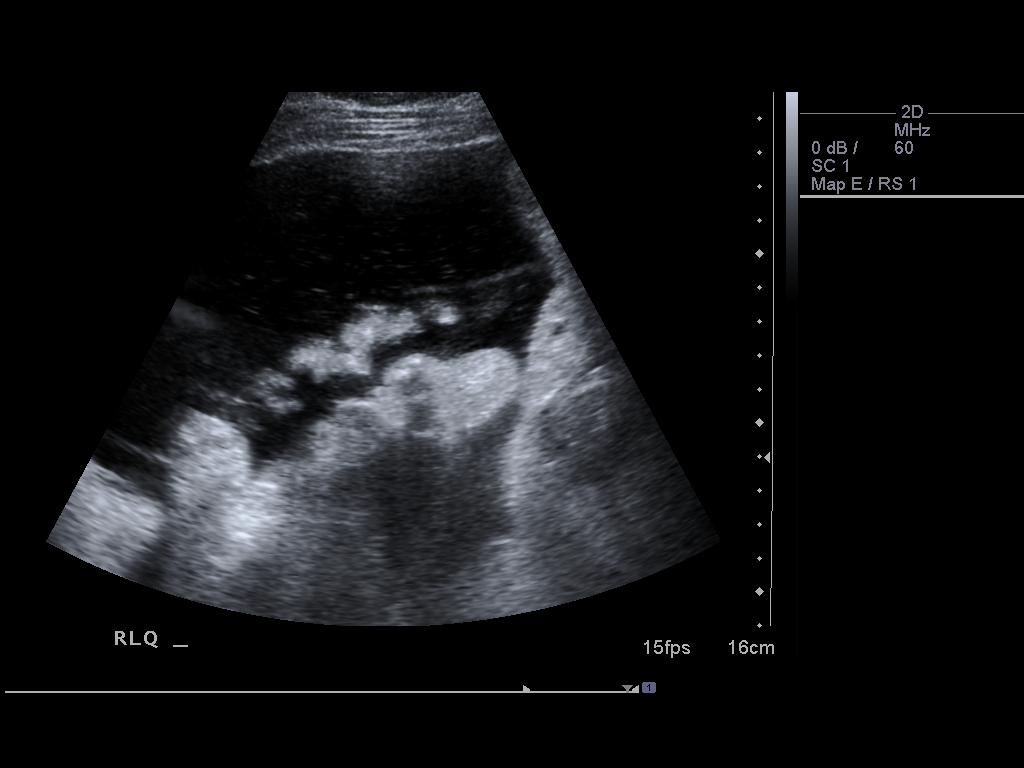
[im 3/5]
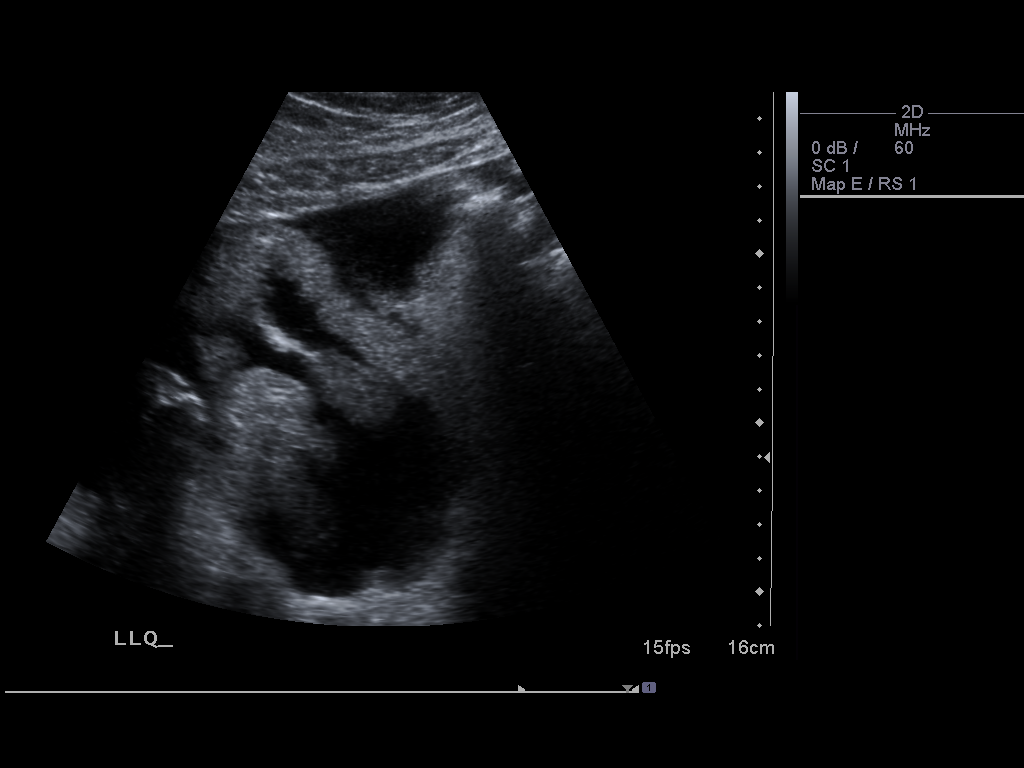
[im 4/5]
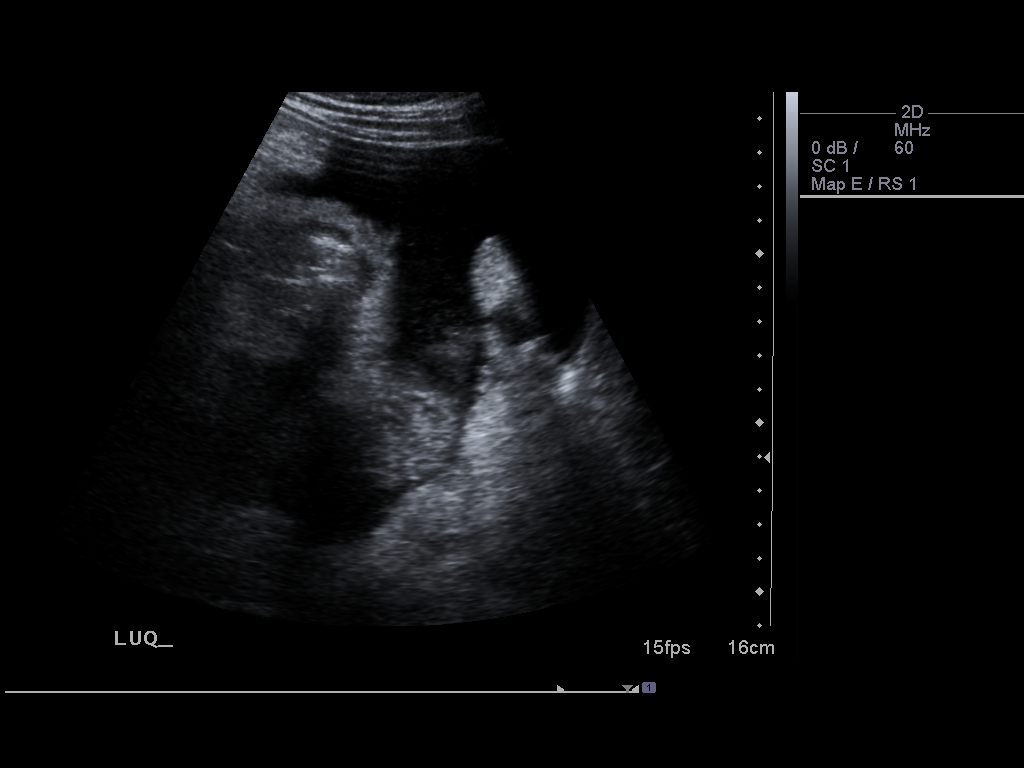
[im 5/5]
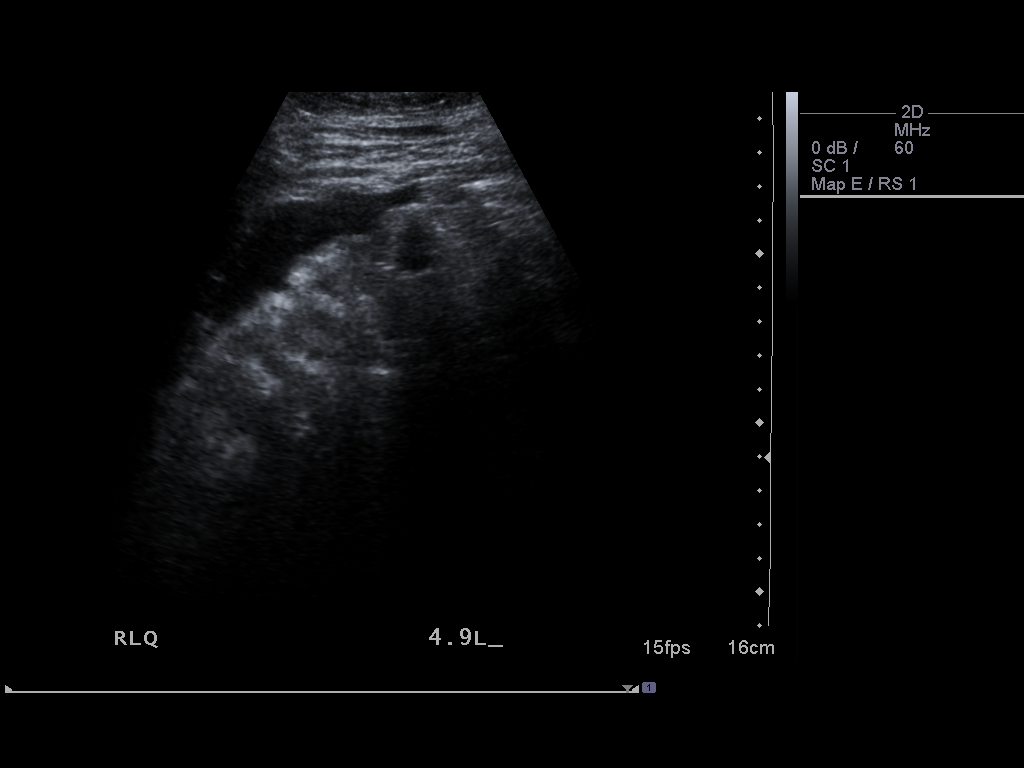

[5 of 5 positions shown; findings below may reference images not displayed]

An ultrasound guided paracentesis was thoroughly discussed with the
patient and questions answered.  The benefits, risks, alternatives
and complications were also discussed.  The patient understands and
wishes to proceed with the procedure.  Written consent was
obtained.

Ultrasound was performed to localize and mark an adequate pocket of
fluid in the right lower quadrant of the abdomen.  The area was
then prepped and draped in the normal sterile fashion.  1%
Lidocaine was used for local anesthesia.  Under ultrasound guidance
a 19 gauge Yueh catheter was introduced.  Paracentesis was
performed.  The catheter was removed and a dressing applied.

Complications:  None
FINDINGS: A total of approximately 4.9 liters of cloudy yellow
fluid was removed.  A fluid sample was not sent for laboratory
analysis.
IMPRESSION: Successful ultrasound guided paracentesis yielding 4.9 liters of
ascites.

Read by: Balani, Irene Almeida.-RECAVARREN

## 2015-09-24 ENCOUNTER — Emergency Department (HOSPITAL_COMMUNITY): Payer: 59

## 2015-09-24 ENCOUNTER — Emergency Department (HOSPITAL_COMMUNITY)
Admission: EM | Admit: 2015-09-24 | Discharge: 2015-09-24 | Disposition: A | Discharge status: Home / Self Care | Payer: 59 | Attending: Emergency Medicine | Admitting: Emergency Medicine

## 2015-09-24 ENCOUNTER — Encounter (HOSPITAL_COMMUNITY): Payer: Self-pay | Admitting: Nurse Practitioner

## 2015-09-24 DIAGNOSIS — Y9389 Activity, other specified: Secondary | ICD-10-CM | POA: Insufficient documentation

## 2015-09-24 DIAGNOSIS — S61011A Laceration without foreign body of right thumb without damage to nail, initial encounter: Secondary | ICD-10-CM | POA: Diagnosis not present

## 2015-09-24 DIAGNOSIS — W268XXA Contact with other sharp object(s), not elsewhere classified, initial encounter: Secondary | ICD-10-CM | POA: Insufficient documentation

## 2015-09-24 DIAGNOSIS — F17210 Nicotine dependence, cigarettes, uncomplicated: Secondary | ICD-10-CM | POA: Insufficient documentation

## 2015-09-24 DIAGNOSIS — I10 Essential (primary) hypertension: Secondary | ICD-10-CM | POA: Insufficient documentation

## 2015-09-24 DIAGNOSIS — Z859 Personal history of malignant neoplasm, unspecified: Secondary | ICD-10-CM | POA: Insufficient documentation

## 2015-09-24 DIAGNOSIS — Y998 Other external cause status: Secondary | ICD-10-CM | POA: Diagnosis not present

## 2015-09-24 DIAGNOSIS — Y9289 Other specified places as the place of occurrence of the external cause: Secondary | ICD-10-CM | POA: Insufficient documentation

## 2015-09-24 DIAGNOSIS — S6991XA Unspecified injury of right wrist, hand and finger(s), initial encounter: Secondary | ICD-10-CM | POA: Diagnosis present

## 2015-09-24 HISTORY — DX: Essential (primary) hypertension: I10

## 2015-09-24 HISTORY — DX: Malignant (primary) neoplasm, unspecified: C80.1

## 2015-09-24 MED ORDER — LIDOCAINE HCL 2 % IJ SOLN
10.0000 mL | Freq: Once | INTRAMUSCULAR | Status: DC
  Filled 2015-09-24: qty 20

## 2015-09-24 MED ORDER — MUPIROCIN 2 % EX OINT: TOPICAL_OINTMENT | CUTANEOUS | Status: AC

## 2015-09-24 MED ORDER — TETANUS-DIPHTH-ACELL PERTUSSIS 5-2.5-18.5 LF-MCG/0.5 IM SUSP
0.5000 mL | Freq: Once | INTRAMUSCULAR | Status: AC
  Administered 2015-09-24: 0.5 mL via INTRAMUSCULAR
  Filled 2015-09-24: qty 0.5

## 2015-09-24 NOTE — ED Notes (Signed)
PA at bedside suturing pt. 

## 2015-09-24 NOTE — ED Provider Notes (Signed)
CSN: [id number]     Arrival date & time 09/24/15  1834 History  By signing my name below, I, [facility] attest that this documentation has been prepared under the direction and in the presence of [employer], [employer]. Electronically Signed: Virgel Bouquet, ED Scribe. 09/24/2015. 10:14 PM.   Chief Complaint  Patient presents with  . Extremity Laceration    Right Thumb   The history is provided by the patient. No language interpreter was used.   HPI Comments: Cameron Lyons is a 60 y.o. male with a hx of HTN, nephrectomy and liver transplant who presents to the Emergency Department complaining of actively bleeding, moderately painful right thumb laceration that occurred 1 hour prior to arrival. Patient reports that he was operating a table top saw to cut a 2x4 board when the saw lacerated his thumb, followed immediately by pain and bleeding. He is able to move his thumb without difficulty. Pain is worse with palpation. He elevated his right thumb, wrapped the area with a wet washcloth and came to ED. He has not washed the area. He has not taken any pain medication. Last tetanus unknown. Denies paraesthesia, numbness.  Past Medical History  Diagnosis Date  . Hypertension   . Cancer [facility])    Past Surgical History  Procedure Laterality Date  . Liver transplant    . Nephrectomy     History reviewed. No pertinent family history. Social History  Substance Use Topics  . Smoking status: Current Every Day Smoker -- 6.00 packs/day    Types: Cigarettes  . Smokeless tobacco: None  . Alcohol Use: No    Review of Systems  Skin: Positive for wound (right thumb laceration).  Neurological: Negative for numbness.    Allergies  Codeine and Dilaudid  Home Medications   Prior to Admission medications   Medication Sig Start Date End Date Taking? Authorizing Provider  mupirocin ointment (BACTROBAN) 2 % Apply to affected area twice daily. 09/24/15   Boots Mcglown Pilcher Markeda Narvaez, PA-C    BP 177/100 mmHg  Pulse 80  Temp(Src) 97.6 F (36.4 C) (Oral)  Resp 18  SpO2 97% Physical Exam  Constitutional: He is oriented to person, place, and time. He appears well-developed and well-nourished. No distress.  HENT:  Head: Normocephalic and atraumatic.  Eyes: Conjunctivae and EOM are normal.  Neck: Neck supple. No tracheal deviation present.  Cardiovascular: Normal rate, regular rhythm and normal heart sounds.  Exam reveals no gallop and no friction rub.   No murmur heard. Pulmonary/Chest: Effort normal and breath sounds normal. No respiratory distress. He has no wheezes. He has no rales.  Musculoskeletal:  Full ROM of RUE.   Neurological: He is alert and oriented to person, place, and time.  RUE neurovascularly intact.   Skin: Skin is warm and dry.  Right thumb with 3 cm laceration and 4 cm laceration. Good cap refill.   Psychiatric: He has a normal mood and affect. His behavior is normal.  Nursing note and vitals reviewed.   ED Course  Procedures   LACERATION REPAIR Performed by: Ozella Almond Marysue Fait  Authorized by: Ozella Almond Tekeyah Santiago Patient identity confirmed: provided demographic data Consent: Verbal consent obtained. Consent given by: patient  Risks and benefits: risks, benefits and alternatives were discussed Prepped and Draped in normal sterile fashion Wound explored Body area: right thumb Location details: palmar right thumb Laceration length: two lacerations: one 3cm, one 4cm Foreign bodies: No foreign bodies seen or palpated. Tendon involvement: none Nerve involvement: none Anesthesia:  Digital block Local anesthetic: lidocaine 2% Anesthetic total: 8 ml Patient sedated: no Irrigation solution: saline Irrigation method: syringe Amount of cleaning: standard Skin closure: 5-0 Number of sutures: 14 Technique: simple interrupted Approximation: close Dressing: antibiotic ointment Patient tolerance: Patient tolerated the procedure well with no immediate  complications.  DIAGNOSTIC STUDIES: Oxygen Saturation is 97% on RA, normal by my interpretation.    COORDINATION OF CARE: 7:09 PM Will order right thumb x-ray. Will perform laceration repair. Will update tetanus. Discussed treatment plan with pt at bedside and pt agreed to plan.  7:34 PM Returned to perform laceration repair.  Imaging Review Dg Finger Thumb Right  09/24/2015  CLINICAL DATA:  Right anterior thumb laceration from a table salt today. EXAM: RIGHT THUMB 2+V COMPARISON:  None. FINDINGS: Soft tissue defect over the distal aspect of the right first finger consistent with history of laceration. No radiopaque foreign bodies demonstrated in this area. Underlying bones appear intact without evidence of acute fracture or dislocation. Degenerative changes are demonstrated in the first carpometacarpal, metacarpal phalangeal, and interphalangeal joints. There is a linear metallic structure demonstrated in or over the trapeze EM. This could represent an old foreign body or an old K-wire from previous surgical intervention. IMPRESSION: Soft tissue laceration of the right first finger. No acute bony abnormalities. Degenerative changes. Metallic foreign body versus old surgical K-wire demonstrated in or over the trapezium. Electronically Signed   By: Lucienne Capers M.D.   On: 09/24/2015 19:40   I have personally reviewed and evaluated these images as part of my medical decision-making.    MDM   Final diagnoses:  Thumb laceration, right, initial encounter   [name] presents after laceration to right thumb just PTA. X-rays were obtained which show no acute bony abnormalities. Foreign body seen on imaging is from prior surgery. Patient was unsure of last tetanus, therefore tetanus updated today in ED. Patient has hx of nephrectomy and liver transplant which limit his pain medication options. Will perform digital block for pain relief while in ED and for lac repair. Patient has Ultram at  home and states no further pain medication needed for home. Wound was thoroughly cleaned and lac repair performed by me as dictated above. Patient tolerated well. Hand surgery referral given for follow up if complications arise, pain worsens, or any concerns. Return to [facility] for suture removal in 7-10 days if healing well. Bacitracin rx given along with wound care instructions. Return precautions given. All questions answered.   I personally performed the services described in this documentation, which was scribed in my presence. The recorded information has been reviewed and is accurate.   [facility] Taras Rask, PA-C 09/24/15 2225  Lacretia Leigh, MD 09/24/15 [phone]

## 2015-09-24 NOTE — ED Notes (Signed)
Pt has a right thumb laceration that he sustained from a power table top saw. Last tetanus unknown.

## 2015-09-24 NOTE — Discharge Instructions (Signed)
Keep wound clean with mild soap and water. Keep area covered with a topical antibiotic ointment and bandage, keep bandage dry, and do not submerge in water for 24 hours. Ice and elevate for additional pain relief and swelling.  Follow up with your primary care doctor or the [facility] [place] in approximately 7 days for wound recheck and suture removal. Follow up with the hand specialist listed above if area is not healing well or you have any problems with the area. Monitor area for signs of infection to include, but not limited to: increasing pain, spreading redness, drainage/pus, worsening swelling, or fevers. Return to emergency department for emergent changing or worsening symptoms.   WOUND CARE  Keep area clean and dry for 24 hours. Do not remove bandage, if applied.  After 24 hours,you should change it at least once a day. Also, change the dressing if it becomes wet or dirty, or as directed by your caregiver.   Wash the wound with soap and water 2 times a day. Rinse the wound off with water to remove all soap. Pat the wound dry with a clean towel.   You may shower as usual after the first 24 hours. Do not soak the wound in water until the sutures are removed.   Once the wound has healed, scarring can be minimized by covering the wound with sunscreen during the day for 1 full year.  Do not apply any ointments or creams to the wound while stitches/staples are in place, as this may cause delayed healing.  Return if you experience any of the following signs of infection: Swelling, redness, pus drainage, streaking, fever >101.0 F  Return if you experience excessive bleeding that does not stop after 15-20 minutes of constant, firm pressure.

## 2018-10-22 ENCOUNTER — Telehealth: Payer: Self-pay | Admitting: Radiation Oncology

## 2018-10-22 NOTE — Telephone Encounter (Signed)
Received call from patient inquiring about consult appointment. Explained no referral has been received. Offered to phone Dr. Zack Seal, MD. Patient verbalized his intent to pursue radiation therapy closer to home. Called and left a detailed voicemail message with Myrtie Hawk, assistant to Dr. Thomos Lemons [phone]). Awaiting return call.

## 2018-10-29 ENCOUNTER — Telehealth: Payer: Self-pay | Admitting: Radiation Oncology

## 2018-10-29 NOTE — Telephone Encounter (Signed)
Phoned patient back to report I have not received a referral from Dr. Merlene Pulling Polascik at [facility] on his behalf. Explained on the voicemail message I have called twice without any response via fax or phone. Strongly encouraged the patient to reach out to Dr. Christia Reading office himself. Provided my direct contact number for future questions or correspondence.

## 2018-10-30 ENCOUNTER — Telehealth: Payer: Self-pay | Admitting: Medical Oncology

## 2018-10-30 NOTE — Telephone Encounter (Signed)
I called Dr.Lee's office-[facility] radiation oncology about referral for radiation at [facility]. Referral is noted in 4/1 office note at [facility] but has not been sent. Per staff the referral will be faxed to [facility]. Suanne Marker and Sam notified.

## 2018-10-31 ENCOUNTER — Telehealth: Payer: Self-pay | Admitting: *Deleted

## 2018-10-31 NOTE — Telephone Encounter (Signed)
Received referral this morning 10/31/18

## 2018-11-03 ENCOUNTER — Encounter: Payer: Self-pay | Admitting: Radiation Oncology

## 2018-11-03 NOTE — Progress Notes (Signed)
GU Location of Tumor / Histology: prostatic adenocarcinoma  If Prostate Cancer, Gleason Score is (3 + 4) and PSA is (17.8) on 05/20/2018. Prostate volume 12 cc  Cameron Lyons is a referral from Dr. Truman Hayward @ [place] because the patient wishes to receive treatment closer to home. Patient has a hx of a liver transplant and low grade papillary urothelial ca, non-invasive. Patient reports bladder cancer was surgically removed. Denies receiving radiation or chemotherapy for his bladder cancer. Patient found to have an elevated PSA of 11 on 04/30/2017.   Biopsies of prostate (if applicable) revealed:     Past/Anticipated interventions by urology, if any: prostate biopsy, referral to Dr. Truman Hayward for consideration of radiotherapy at [facility]  Past/Anticipated interventions by medical oncology, if any: no  Weight changes, if any: no  Bowel/Bladder complaints, if any: IPSS 9. SHIM 17. Denies dysuria or hematuria. Denies urinary leakage or incontinence. Denies any bowel complaints.    Nausea/Vomiting, if any: no  Pain issues, if any:  Reports constant mild pain at site of liver transplant.   SAFETY ISSUES:  Prior radiation? no  Pacemaker/ICD? no  Possible current pregnancy? no, male patient  Is the patient on methotrexate? no  Current Complaints / other details:  63 year old male. Married. Smoker.

## 2018-11-04 ENCOUNTER — Ambulatory Visit: Payer: 59

## 2018-11-04 ENCOUNTER — Encounter: Payer: Self-pay | Admitting: Radiation Oncology

## 2018-11-04 ENCOUNTER — Other Ambulatory Visit: Payer: Self-pay

## 2018-11-04 ENCOUNTER — Ambulatory Visit
Admission: RE | Admit: 2018-11-04 | Discharge: 2018-11-04 | Disposition: A | Discharge status: Home / Self Care | Payer: 59 | Source: Ambulatory Visit | Attending: Radiation Oncology | Admitting: Radiation Oncology

## 2018-11-04 ENCOUNTER — Ambulatory Visit: Payer: 59 | Admitting: Radiation Oncology

## 2018-11-04 DIAGNOSIS — C61 Malignant neoplasm of prostate: Secondary | ICD-10-CM

## 2018-11-04 HISTORY — DX: Malignant neoplasm of prostate: C61

## 2018-11-04 HISTORY — DX: Malignant neoplasm of bladder, unspecified: C67.9

## 2018-11-04 NOTE — Progress Notes (Signed)
See progress note under physician encounter. 

## 2018-11-04 NOTE — Progress Notes (Signed)
Radiation Oncology         [phone] ________________________________  Initial Outpatient Consultation - Conducted via WebEx due to current COVID-19 concerns for limiting patient exposure  Name: Cameron Lyons MRN: [id number]  Date: 11/04/2018  DOB: 06/03/1956  CC:No primary care provider on file.  Ramon Dredge, MD   REFERRING PHYSICIAN: Ramon Dredge, MD  DIAGNOSIS: 63 y.o. 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    ICD-10-CM   1. Malignant neoplasm of prostate ([place]) C61     HISTORY OF PRESENT ILLNESS: Cameron Lyons is a 63 y.o. male with a diagnosis of prostate cancer. He is a longtime patient of Dr. 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  He has been followed in surveillance since that time and has had 1 recurrence in November 2017 which showed stable, PTA low-grade disease.  His most recent CT abdomen pelvis and cystoscopy in February 2020 were negative for recurrent disease or metastases.  He was noted to have an elevated PSA of 11 in October 2018 and more recently, up to 17.8 in November 2019. He was noted to have a palpable abnormality at the right apex on DRE 07/15/18.  Accordingly, he proceeded to transrectal ultrasound with 12 biopsies of the prostate on 08/25/2018.  The prostate volume measured 11.4 cc.  Out of 12 core biopsies, 11 were positive.  The maximum Gleason score was 3+4, and this was seen in 10 of the 11+ cores-specifically in the right middle base, right lateral mid, right middle mid, right lateral apex, right middle apex, left lateral base, left middle base, left lateral mid, left middle mid, and left middle apex. Additionally, Gleason 3+3 disease was seen in the right lateral base.    The patient reviewed the biopsy results with his urologist and was referred to Dr. Truman Hayward in radiation oncology at [facility] for consideration of radiotherapy. However, the patient lives in [place] and wishes to receive radiation treatments closer to home. Therefore, he has kindly been referred to our clinic today for discussion of potential radiation treatment options.  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   PREVIOUS RADIATION THERAPY: No  PAST MEDICAL HISTORY:  Past Medical History:  Diagnosis Date  . Bladder cancer ([place])   . Hypertension   . Prostate cancer ([place])       PAST SURGICAL HISTORY: Past Surgical History:  Procedure Laterality Date  . LIVER TRANSPLANT    . NEPHRECTOMY    . PROSTATE BIOPSY      FAMILY HISTORY:  Family History  Problem Relation Age of Onset  . Head & neck cancer Maternal Grandmother     SOCIAL HISTORY:  Social History   Socioeconomic History  . Marital status: Married    Spouse name: Not on file  . Number of children: 1  . Years of education: Not on file  . Highest education level: Not on file  Occupational History  . Not on file  Social Needs  .  Financial resource strain: Not on file  . Food insecurity:    Worry: Not on file    Inability: Not on file  . Transportation needs:    Medical: Not on file    Non-medical: Not on file  Tobacco Use  . Smoking status: Current Every Day Smoker    Packs/day: 0.25    Years: 57.00    Pack years: 14.25    Types: Cigarettes  . Smokeless tobacco: Never Used  . Tobacco comment: reports smoking approximately 3 cigarettes  Substance and Sexual Activity  . Alcohol use: Not Currently  . Drug  use: Never  . Sexual activity: Yes  Lifestyle  . Physical activity:    Days per week: Not on file    Minutes per session: Not on file  . Stress: Not on file  Relationships  . Social connections:    Talks on phone: Not on file    Gets together: Not on file    Attends religious service: Not on file    Active member of club or organization: Not on file    Attends meetings of clubs or organizations: Not on file    Relationship status: Not on file  . Intimate partner violence:    Fear of current or ex partner: Not on file    Emotionally abused: Not on file    Physically abused: Not on file    Forced sexual activity: Not on file  Other Topics Concern  . Not on file  Social History Narrative  . Not on file    ALLERGIES: Codeine and Dilaudid [hydromorphone hcl]  MEDICATIONS:  Current Outpatient Medications  Medication Sig Dispense Refill  . amLODipine (NORVASC) 10 MG tablet Take 10 mg by mouth daily.    Marland Kitchen aspirin 81 MG tablet Take by mouth.    . calcium citrate-vitamin D (CITRACAL+D) 315-200 MG-UNIT tablet Take by mouth.    . cloNIDine (CATAPRES) 0.3 MG tablet Take 0.3 mg by mouth 3 (three) times daily.    . hydrochlorothiazide (HYDRODIURIL) 25 MG tablet Take 25 mg by mouth daily.    Marland Kitchen lisinopril (ZESTRIL) 20 MG tablet Take by mouth.    . metFORMIN (GLUCOPHAGE-XR) 500 MG 24 hr tablet Take 1,000 mg by mouth 2 (two) times daily.    . metoprolol tartrate (LOPRESSOR) 25 MG tablet Take 25 mg by mouth 2 (two) times daily.    . mycophenolate (CELLCEPT) 250 MG capsule Reports taking one tablet in the morning and one tablet at night.    Marland Kitchen omeprazole (PRILOSEC) 20 MG capsule Take 20 mg by mouth daily.    . tacrolimus (PROGRAF) 1 MG capsule TAKE 2 CAPSULES BY MOUTH EVERY MORNING AND 1 CAPSULE EVERY EVENING    . traMADol (ULTRAM) 50 MG tablet Take 50 mg by mouth every 6 (six) hours as needed. for pain    . mupirocin ointment (BACTROBAN) 2 % Apply to affected area twice daily. (Patient not  taking: Reported on 11/04/2018) 15 g 0   No current facility-administered medications for this encounter.     REVIEW OF SYSTEMS:  On review of systems, the patient reports that he is doing well overall. He denies any chest pain, shortness of breath, cough, fevers, chills, night sweats, or unintended weight changes. He denies any bowel disturbances, and denies abdominal pain, nausea or vomiting. He reports constant mild pain at site of liver transplant, but denies any new musculoskeletal or joint aches or pains. His IPSS was 9, indicating mild-moderate urinary symptoms. He denies  dysuria, hematuria, leakage or incontinence. His SHIM was 17, indicating he does have mild erectile dysfunction. A complete review of systems is obtained and is otherwise negative.    PHYSICAL EXAM:  Wt Readings from Last 3 Encounters:  11/04/18 (P) 162 lb (73.5 kg)   Temp Readings from Last 3 Encounters:  09/24/15 97.6 F (36.4 C) (Oral)   BP Readings from Last 3 Encounters:  09/24/15 177/100   Pulse Readings from Last 3 Encounters:  09/24/15 80   Pain Assessment Pain Score: 2  Pain Frequency: Constant Pain Loc: Abdomen(side from liver transplant)/10  In general this is a well appearing caucasian male in no acute distress. He's alert and oriented x4 and appropriate and cooperative throughout the encounter. Cardiopulmonary assessment is negative for acute distress and he exhibits normal effort. Remainder of exam not performed in light of virtual consultation platform.  LABORATORY DATA:  Lab Results  Component Value Date   WBC 10.2 04/06/2010   HGB 12.8 (L) 04/06/2010   HCT 33.7 (L) 04/06/2010   MCV 100.3 (H) 04/06/2010   PLT 120 (L) 04/06/2010   Lab Results  Component Value Date   NA 135 05/12/2010   K 3.4 (L) 05/12/2010   CL 99 05/12/2010   CO2 27 05/12/2010   Lab Results  Component Value Date   ALT 88 (H) 04/06/2010   AST 128 (H) 04/06/2010   ALKPHOS 118 (H) 04/06/2010   BILITOT 5.3 (H)  04/06/2010     RADIOGRAPHY: No results found.    IMPRESSION/PLAN: 1. 63 y.o. gentleman with Stage T2a adenocarcinoma of the prostate with Gleason Score of 3+4, and PSA of 17.8. We discussed the patient's workup and outlined the nature of prostate cancer in this setting. The patient's T stage, Gleason's score, and PSA put him into the unfavorable-intermediate risk group. Accordingly, he is eligible for 5.5 weeks of external radiation with or without the addition of ST-ADT. We discussed the available radiation techniques, and focused on the details and logistics and delivery. We discussed and outlined the risks, benefits, short and long-term effects associated with radiotherapy and compared and contrasted these with prostatectomy. We also detailed the role of ADT in the treatment of intermediate risk prostate cancer and outlined the associated side effects that could be expected with this therapy.  Given his unfavorable intermediate risk prostate cancer along with the recommendation to postpone his start of treatment in the current setting of COVID-19 to reduce his risk of exposure with his history of liver transplant, we would recommend the addition of short-term androgen deprivation therapy.  We would advise starting ADT now in anticipation of beginning his daily prostate IMRT approximately 2 months thereafter.  He was encouraged to ask questions that were answered to his stated satisfaction.  At the end of the conversation, the patient is interested in moving forward with 5.5 weeks of external beam therapy in combination with ST-ADT. He has not received his first Lupron injection. We will contact Dr. Thomos Lemons to make arrangements for start of ADT now with plans to schedule him for CT simulation for treatment planning approximately two months after the start of ADT.  He appears to have a good understanding of his disease and our treatment recommendations which are of curative intent.  He is comfortable with  and in agreement with the stated plan.  This encounter was provided by telemedicine platform Webex.  The patient has given verbal consent for this type of encounter and has been advised to only accept a meeting  of this type in a secure network environment. The time spent during this encounter was 60 minutes and 50% of that time was spent in review of outside records and coordination of the patient's care. The attendants for this meeting include Tyler Pita MD, Freeman Caldron PA-C, scribe Clinton Sawyer, and patient, Cameron Lyons.  During the encounter, Tyler Pita MD, Ashlyn Bruning PA-C, and scribe Clinton Sawyer were located at [facility] Radiation Oncology Department.  Patient, GLEASON ARDOIN was located at home.     Nicholos Johns, PA-C    Tyler Pita, MD  [facility] Direct Dial: [phone]  Fax: [phone] [email or web address]  Skype  [email or web address]  This document serves as a record of services personally performed by Tyler Pita, MD and Freeman Caldron, PA-C. It was created on their behalf by Rae Lips, a trained medical scribe. The creation of this record is based on the scribe's personal observations and the providers' statements to them. This document has been checked and approved by the attending providers.

## 2018-11-05 ENCOUNTER — Telehealth: Payer: Self-pay | Admitting: *Deleted

## 2018-11-05 DIAGNOSIS — C61 Malignant neoplasm of prostate: Secondary | ICD-10-CM | POA: Insufficient documentation

## 2018-11-05 NOTE — Telephone Encounter (Signed)
CALLED PATIENT TO INFORM OF ADT APPT. FOR 12-08-18 - ARRIVAL TIME - 3:10 PM @ DR. POLASCIK'S OFFICE @ [facility], [place]. NO. - [phone], SPOKE WITH PATIENT AND HE IS AWARE OF THIS APPT.

## 2018-11-05 NOTE — Telephone Encounter (Signed)
RETURNED PATIENT'S WIFE- Cameron Lyons PHONE CALL, SPOKE WITH PATIENT'S WIFE

## 2018-11-11 ENCOUNTER — Telehealth: Payer: Self-pay | Admitting: Medical Oncology

## 2018-11-11 NOTE — Telephone Encounter (Signed)
Called patient to introduce myself as the prostate nurse navigator and my role. I was unable to meet him due to consult 5/5 with Dr. Tammi Klippel was held by [facility]. He states the consult went well. He will receive ADT with radiation. He had questions about ADT and side effects. All questions were answered.  He is aware of ADT injection 6/8 at Dr. Christia Reading office at [facility]. He voiced concerns about injection being so far out. If possible he would like to get it sooner so he could start radiation before August. I will call to see if we can get appointment earlier.

## 2018-11-27 ENCOUNTER — Encounter: Payer: Self-pay | Admitting: Medical Oncology

## 2018-11-27 ENCOUNTER — Telehealth: Payer: Self-pay | Admitting: Medical Oncology

## 2018-11-28 ENCOUNTER — Telehealth: Payer: Self-pay | Admitting: Medical Oncology

## 2018-11-28 NOTE — Telephone Encounter (Signed)
Called Dr. Orlene Erm office-[facility] to see if ADT appointment could be scheduled earlier than 6/8. There have been no cancellations and the schedule is booked. I will notify patient.

## 2018-11-28 NOTE — Progress Notes (Signed)
Spoke with patient to see if he has received his ADT. He is scheduled for 6/08. He states that he has not been contacted about an earlier appointment. He asked if I could call back and see if he can come next week. I will contact them and call him back.

## 2018-11-28 NOTE — Telephone Encounter (Signed)
Informed patient I called Dr. Orlene Erm office and they have had no cancellations and no availability to move his injection appointment to an earlier date. He will receive ADT 6/8 and I informed him he will be contacted by Enid Derry to scheduled CT simulation. He voiced understanding.

## 2018-12-09 ENCOUNTER — Telehealth: Payer: Self-pay | Admitting: Medical Oncology

## 2018-12-09 NOTE — Telephone Encounter (Signed)
Left message requesting a return call to confirm his ADT injection at [facility] yesterday.

## 2018-12-09 NOTE — Telephone Encounter (Signed)
Patient returned call to confirm he received Lupron 6/8 at [facility]. I informed him radiation will start in about 8 weeks and Enid Derry will contact him with planning appointments. He voiced understanding. Ashlyn, Gay notified.

## 2019-01-29 ENCOUNTER — Telehealth: Payer: Self-pay | Admitting: *Deleted

## 2019-01-29 NOTE — Telephone Encounter (Signed)
CALLED PATIENT TO REMIND OF 01-30-19 - ARRIVAL TIME- 9:30 AM @ [place] FOR SIM, SPOKE WITH PATIENT AND HE IS AWARE OF THIS APPT.

## 2019-01-29 NOTE — Telephone Encounter (Signed)
Called patient to inform that he would not be able to bring his wife to listen for him, but he can have with cell phone capability to listen in via speaker phone for interaction and contribution to conversation, patient verified understanding this and he is o.k. with this

## 2019-01-30 ENCOUNTER — Other Ambulatory Visit: Payer: Self-pay

## 2019-01-30 ENCOUNTER — Encounter: Payer: Self-pay | Admitting: Medical Oncology

## 2019-01-30 ENCOUNTER — Ambulatory Visit
Admission: RE | Admit: 2019-01-30 | Discharge: 2019-01-30 | Disposition: A | Discharge status: Home / Self Care | Payer: 59 | Source: Ambulatory Visit | Attending: Radiation Oncology | Admitting: Radiation Oncology

## 2019-01-30 DIAGNOSIS — C61 Malignant neoplasm of prostate: Secondary | ICD-10-CM | POA: Insufficient documentation

## 2019-01-30 DIAGNOSIS — Z510 Encounter for antineoplastic radiation therapy: Secondary | ICD-10-CM | POA: Diagnosis not present

## 2019-01-30 NOTE — Progress Notes (Signed)
  Radiation Oncology         [phone] ________________________________  Name: Cameron Lyons MRN: [id number]  Date: 01/30/2019  DOB: April 06, 1956  SIMULATION AND TREATMENT PLANNING NOTE    ICD-10-CM   1. Malignant neoplasm of prostate ([place])  C61     DIAGNOSIS:  63 y.o. 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  NARRATIVE:  The patient was brought to the [place].  Identity was confirmed.  All relevant records and images related to the planned course of therapy were reviewed.  The patient freely provided informed written consent to proceed with treatment after reviewing the details related to the planned course of therapy. The consent form was witnessed and verified by the simulation staff.  Then, the patient was set-up in a stable reproducible supine position for radiation therapy.  A vacuum lock pillow device was custom fabricated to position his legs in a reproducible immobilized position.  Then, I performed a urethrogram under sterile conditions to identify the prostatic apex.  CT images were obtained.  Surface markings were placed.  The CT images were loaded into the planning software.  Then the prostate target and avoidance structures including the rectum, bladder, bowel and hips were contoured.  Treatment planning then occurred.  The radiation prescription was entered and confirmed.  A total of one complex treatment devices was fabricated. I have requested : Intensity Modulated Radiotherapy (IMRT) is medically necessary for this case for the following reason:  Rectal sparing.Marland Kitchen  PLAN:  The patient will receive 70 Gy in 28 fractions.  ________________________________  Sheral Apley Tammi Klippel, M.D.

## 2019-02-02 DIAGNOSIS — Z510 Encounter for antineoplastic radiation therapy: Secondary | ICD-10-CM | POA: Insufficient documentation

## 2019-02-02 DIAGNOSIS — C61 Malignant neoplasm of prostate: Secondary | ICD-10-CM | POA: Insufficient documentation

## 2019-02-04 DIAGNOSIS — Z510 Encounter for antineoplastic radiation therapy: Secondary | ICD-10-CM | POA: Diagnosis not present

## 2019-02-04 DIAGNOSIS — C61 Malignant neoplasm of prostate: Secondary | ICD-10-CM | POA: Diagnosis not present

## 2019-02-09 ENCOUNTER — Encounter: Payer: Self-pay | Admitting: Medical Oncology

## 2019-02-09 ENCOUNTER — Ambulatory Visit
Admission: RE | Admit: 2019-02-09 | Discharge: 2019-02-09 | Disposition: A | Discharge status: Home / Self Care | Payer: 59 | Source: Ambulatory Visit | Attending: Radiation Oncology | Admitting: Radiation Oncology

## 2019-02-09 ENCOUNTER — Other Ambulatory Visit: Payer: Self-pay

## 2019-02-09 DIAGNOSIS — Z510 Encounter for antineoplastic radiation therapy: Secondary | ICD-10-CM | POA: Diagnosis not present

## 2019-02-10 ENCOUNTER — Other Ambulatory Visit: Payer: Self-pay

## 2019-02-10 ENCOUNTER — Ambulatory Visit
Admission: RE | Admit: 2019-02-10 | Discharge: 2019-02-10 | Disposition: A | Discharge status: Home / Self Care | Payer: 59 | Source: Ambulatory Visit | Attending: Radiation Oncology | Admitting: Radiation Oncology

## 2019-02-10 DIAGNOSIS — Z510 Encounter for antineoplastic radiation therapy: Secondary | ICD-10-CM | POA: Diagnosis not present

## 2019-02-11 ENCOUNTER — Other Ambulatory Visit: Payer: Self-pay

## 2019-02-11 ENCOUNTER — Ambulatory Visit
Admission: RE | Admit: 2019-02-11 | Discharge: 2019-02-11 | Disposition: A | Discharge status: Home / Self Care | Payer: 59 | Source: Ambulatory Visit | Attending: Radiation Oncology | Admitting: Radiation Oncology

## 2019-02-11 DIAGNOSIS — Z510 Encounter for antineoplastic radiation therapy: Secondary | ICD-10-CM | POA: Diagnosis not present

## 2019-02-12 ENCOUNTER — Ambulatory Visit
Admission: RE | Admit: 2019-02-12 | Discharge: 2019-02-12 | Disposition: A | Discharge status: Home / Self Care | Payer: 59 | Source: Ambulatory Visit | Attending: Radiation Oncology | Admitting: Radiation Oncology

## 2019-02-12 ENCOUNTER — Other Ambulatory Visit: Payer: Self-pay

## 2019-02-12 DIAGNOSIS — Z510 Encounter for antineoplastic radiation therapy: Secondary | ICD-10-CM | POA: Diagnosis not present

## 2019-02-13 ENCOUNTER — Ambulatory Visit
Admission: RE | Admit: 2019-02-13 | Discharge: 2019-02-13 | Disposition: A | Discharge status: Home / Self Care | Payer: 59 | Source: Ambulatory Visit | Attending: Radiation Oncology | Admitting: Radiation Oncology

## 2019-02-13 ENCOUNTER — Other Ambulatory Visit: Payer: Self-pay

## 2019-02-13 DIAGNOSIS — Z510 Encounter for antineoplastic radiation therapy: Secondary | ICD-10-CM | POA: Diagnosis not present

## 2019-02-16 ENCOUNTER — Ambulatory Visit
Admission: RE | Admit: 2019-02-16 | Discharge: 2019-02-16 | Disposition: A | Discharge status: Home / Self Care | Payer: 59 | Source: Ambulatory Visit | Attending: Radiation Oncology | Admitting: Radiation Oncology

## 2019-02-16 ENCOUNTER — Other Ambulatory Visit: Payer: Self-pay

## 2019-02-16 DIAGNOSIS — Z510 Encounter for antineoplastic radiation therapy: Secondary | ICD-10-CM | POA: Diagnosis not present

## 2019-02-17 ENCOUNTER — Ambulatory Visit
Admission: RE | Admit: 2019-02-17 | Discharge: 2019-02-17 | Disposition: A | Discharge status: Home / Self Care | Payer: 59 | Source: Ambulatory Visit | Attending: Radiation Oncology | Admitting: Radiation Oncology

## 2019-02-17 ENCOUNTER — Other Ambulatory Visit: Payer: Self-pay

## 2019-02-17 DIAGNOSIS — Z510 Encounter for antineoplastic radiation therapy: Secondary | ICD-10-CM | POA: Diagnosis not present

## 2019-02-18 ENCOUNTER — Other Ambulatory Visit: Payer: Self-pay

## 2019-02-18 ENCOUNTER — Ambulatory Visit
Admission: RE | Admit: 2019-02-18 | Discharge: 2019-02-18 | Disposition: A | Discharge status: Home / Self Care | Payer: 59 | Source: Ambulatory Visit | Attending: Radiation Oncology | Admitting: Radiation Oncology

## 2019-02-18 DIAGNOSIS — Z510 Encounter for antineoplastic radiation therapy: Secondary | ICD-10-CM | POA: Diagnosis not present

## 2019-02-19 ENCOUNTER — Ambulatory Visit
Admission: RE | Admit: 2019-02-19 | Discharge: 2019-02-19 | Disposition: A | Discharge status: Home / Self Care | Payer: 59 | Source: Ambulatory Visit | Attending: Radiation Oncology | Admitting: Radiation Oncology

## 2019-02-19 ENCOUNTER — Other Ambulatory Visit: Payer: Self-pay

## 2019-02-19 DIAGNOSIS — Z510 Encounter for antineoplastic radiation therapy: Secondary | ICD-10-CM | POA: Diagnosis not present

## 2019-02-20 ENCOUNTER — Other Ambulatory Visit: Payer: Self-pay

## 2019-02-20 ENCOUNTER — Ambulatory Visit
Admission: RE | Admit: 2019-02-20 | Discharge: 2019-02-20 | Disposition: A | Discharge status: Home / Self Care | Payer: 59 | Source: Ambulatory Visit | Attending: Radiation Oncology | Admitting: Radiation Oncology

## 2019-02-20 DIAGNOSIS — Z510 Encounter for antineoplastic radiation therapy: Secondary | ICD-10-CM | POA: Diagnosis not present

## 2019-02-23 ENCOUNTER — Other Ambulatory Visit: Payer: Self-pay

## 2019-02-23 ENCOUNTER — Ambulatory Visit
Admission: RE | Admit: 2019-02-23 | Discharge: 2019-02-23 | Disposition: A | Discharge status: Home / Self Care | Payer: 59 | Source: Ambulatory Visit | Attending: Radiation Oncology | Admitting: Radiation Oncology

## 2019-02-23 DIAGNOSIS — Z510 Encounter for antineoplastic radiation therapy: Secondary | ICD-10-CM | POA: Diagnosis not present

## 2019-02-24 ENCOUNTER — Ambulatory Visit
Admission: RE | Admit: 2019-02-24 | Discharge: 2019-02-24 | Disposition: A | Discharge status: Home / Self Care | Payer: 59 | Source: Ambulatory Visit | Attending: Radiation Oncology | Admitting: Radiation Oncology

## 2019-02-24 ENCOUNTER — Other Ambulatory Visit: Payer: Self-pay

## 2019-02-24 DIAGNOSIS — Z510 Encounter for antineoplastic radiation therapy: Secondary | ICD-10-CM | POA: Diagnosis not present

## 2019-02-25 ENCOUNTER — Other Ambulatory Visit: Payer: Self-pay

## 2019-02-25 ENCOUNTER — Ambulatory Visit
Admission: RE | Admit: 2019-02-25 | Discharge: 2019-02-25 | Disposition: A | Discharge status: Home / Self Care | Payer: 59 | Source: Ambulatory Visit | Attending: Radiation Oncology | Admitting: Radiation Oncology

## 2019-02-25 DIAGNOSIS — Z510 Encounter for antineoplastic radiation therapy: Secondary | ICD-10-CM | POA: Diagnosis not present

## 2019-02-26 ENCOUNTER — Ambulatory Visit
Admission: RE | Admit: 2019-02-26 | Discharge: 2019-02-26 | Disposition: A | Discharge status: Home / Self Care | Payer: 59 | Source: Ambulatory Visit | Attending: Radiation Oncology | Admitting: Radiation Oncology

## 2019-02-26 ENCOUNTER — Other Ambulatory Visit: Payer: Self-pay

## 2019-02-26 DIAGNOSIS — Z510 Encounter for antineoplastic radiation therapy: Secondary | ICD-10-CM | POA: Diagnosis not present

## 2019-02-27 ENCOUNTER — Other Ambulatory Visit: Payer: Self-pay

## 2019-02-27 ENCOUNTER — Ambulatory Visit
Admission: RE | Admit: 2019-02-27 | Discharge: 2019-02-27 | Disposition: A | Discharge status: Home / Self Care | Payer: 59 | Source: Ambulatory Visit | Attending: Radiation Oncology | Admitting: Radiation Oncology

## 2019-02-27 DIAGNOSIS — Z510 Encounter for antineoplastic radiation therapy: Secondary | ICD-10-CM | POA: Diagnosis not present

## 2019-03-02 ENCOUNTER — Other Ambulatory Visit: Payer: Self-pay

## 2019-03-02 ENCOUNTER — Ambulatory Visit
Admission: RE | Admit: 2019-03-02 | Discharge: 2019-03-02 | Disposition: A | Discharge status: Home / Self Care | Payer: 59 | Source: Ambulatory Visit | Attending: Radiation Oncology | Admitting: Radiation Oncology

## 2019-03-02 DIAGNOSIS — Z510 Encounter for antineoplastic radiation therapy: Secondary | ICD-10-CM | POA: Diagnosis not present

## 2019-03-03 ENCOUNTER — Other Ambulatory Visit: Payer: Self-pay

## 2019-03-03 ENCOUNTER — Ambulatory Visit
Admission: RE | Admit: 2019-03-03 | Discharge: 2019-03-03 | Disposition: A | Discharge status: Home / Self Care | Payer: 59 | Source: Ambulatory Visit | Attending: Radiation Oncology | Admitting: Radiation Oncology

## 2019-03-03 DIAGNOSIS — Z510 Encounter for antineoplastic radiation therapy: Secondary | ICD-10-CM | POA: Insufficient documentation

## 2019-03-03 DIAGNOSIS — C61 Malignant neoplasm of prostate: Secondary | ICD-10-CM | POA: Insufficient documentation

## 2019-03-04 ENCOUNTER — Ambulatory Visit
Admission: RE | Admit: 2019-03-04 | Discharge: 2019-03-04 | Disposition: A | Discharge status: Home / Self Care | Payer: 59 | Source: Ambulatory Visit | Attending: Radiation Oncology | Admitting: Radiation Oncology

## 2019-03-04 ENCOUNTER — Other Ambulatory Visit: Payer: Self-pay

## 2019-03-04 DIAGNOSIS — Z510 Encounter for antineoplastic radiation therapy: Secondary | ICD-10-CM | POA: Diagnosis not present

## 2019-03-05 ENCOUNTER — Ambulatory Visit
Admission: RE | Admit: 2019-03-05 | Discharge: 2019-03-05 | Disposition: A | Discharge status: Home / Self Care | Payer: 59 | Source: Ambulatory Visit | Attending: Radiation Oncology | Admitting: Radiation Oncology

## 2019-03-05 ENCOUNTER — Other Ambulatory Visit: Payer: Self-pay

## 2019-03-05 DIAGNOSIS — Z510 Encounter for antineoplastic radiation therapy: Secondary | ICD-10-CM | POA: Diagnosis not present

## 2019-03-06 ENCOUNTER — Other Ambulatory Visit: Payer: Self-pay

## 2019-03-06 ENCOUNTER — Ambulatory Visit
Admission: RE | Admit: 2019-03-06 | Discharge: 2019-03-06 | Disposition: A | Discharge status: Home / Self Care | Payer: 59 | Source: Ambulatory Visit | Attending: Radiation Oncology | Admitting: Radiation Oncology

## 2019-03-06 DIAGNOSIS — Z510 Encounter for antineoplastic radiation therapy: Secondary | ICD-10-CM | POA: Diagnosis not present

## 2019-03-10 ENCOUNTER — Other Ambulatory Visit: Payer: Self-pay

## 2019-03-10 ENCOUNTER — Ambulatory Visit
Admission: RE | Admit: 2019-03-10 | Discharge: 2019-03-10 | Disposition: A | Discharge status: Home / Self Care | Payer: 59 | Source: Ambulatory Visit | Attending: Radiation Oncology | Admitting: Radiation Oncology

## 2019-03-10 DIAGNOSIS — Z510 Encounter for antineoplastic radiation therapy: Secondary | ICD-10-CM | POA: Diagnosis not present

## 2019-03-11 ENCOUNTER — Ambulatory Visit
Admission: RE | Admit: 2019-03-11 | Discharge: 2019-03-11 | Disposition: A | Discharge status: Home / Self Care | Payer: 59 | Source: Ambulatory Visit | Attending: Radiation Oncology | Admitting: Radiation Oncology

## 2019-03-11 ENCOUNTER — Other Ambulatory Visit: Payer: Self-pay

## 2019-03-11 DIAGNOSIS — Z510 Encounter for antineoplastic radiation therapy: Secondary | ICD-10-CM | POA: Diagnosis not present

## 2019-03-12 ENCOUNTER — Other Ambulatory Visit: Payer: Self-pay

## 2019-03-12 ENCOUNTER — Ambulatory Visit
Admission: RE | Admit: 2019-03-12 | Discharge: 2019-03-12 | Disposition: A | Discharge status: Home / Self Care | Payer: 59 | Source: Ambulatory Visit | Attending: Radiation Oncology | Admitting: Radiation Oncology

## 2019-03-12 DIAGNOSIS — Z510 Encounter for antineoplastic radiation therapy: Secondary | ICD-10-CM | POA: Diagnosis not present

## 2019-03-13 ENCOUNTER — Other Ambulatory Visit: Payer: Self-pay

## 2019-03-13 ENCOUNTER — Ambulatory Visit
Admission: RE | Admit: 2019-03-13 | Discharge: 2019-03-13 | Disposition: A | Discharge status: Home / Self Care | Payer: 59 | Source: Ambulatory Visit | Attending: Radiation Oncology | Admitting: Radiation Oncology

## 2019-03-13 DIAGNOSIS — Z510 Encounter for antineoplastic radiation therapy: Secondary | ICD-10-CM | POA: Diagnosis not present

## 2019-03-16 ENCOUNTER — Ambulatory Visit
Admission: RE | Admit: 2019-03-16 | Discharge: 2019-03-16 | Disposition: A | Discharge status: Home / Self Care | Payer: 59 | Source: Ambulatory Visit | Attending: Radiation Oncology | Admitting: Radiation Oncology

## 2019-03-16 ENCOUNTER — Other Ambulatory Visit: Payer: Self-pay

## 2019-03-16 DIAGNOSIS — Z510 Encounter for antineoplastic radiation therapy: Secondary | ICD-10-CM | POA: Diagnosis not present

## 2019-03-17 ENCOUNTER — Other Ambulatory Visit: Payer: Self-pay

## 2019-03-17 ENCOUNTER — Ambulatory Visit
Admission: RE | Admit: 2019-03-17 | Discharge: 2019-03-17 | Disposition: A | Discharge status: Home / Self Care | Payer: 59 | Source: Ambulatory Visit | Attending: Radiation Oncology | Admitting: Radiation Oncology

## 2019-03-17 DIAGNOSIS — Z510 Encounter for antineoplastic radiation therapy: Secondary | ICD-10-CM | POA: Diagnosis not present

## 2019-03-18 ENCOUNTER — Other Ambulatory Visit: Payer: Self-pay

## 2019-03-18 ENCOUNTER — Ambulatory Visit
Admission: RE | Admit: 2019-03-18 | Discharge: 2019-03-18 | Disposition: A | Discharge status: Home / Self Care | Payer: 59 | Source: Ambulatory Visit | Attending: Radiation Oncology | Admitting: Radiation Oncology

## 2019-03-18 DIAGNOSIS — Z510 Encounter for antineoplastic radiation therapy: Secondary | ICD-10-CM | POA: Diagnosis not present

## 2019-03-19 ENCOUNTER — Other Ambulatory Visit: Payer: Self-pay

## 2019-03-19 ENCOUNTER — Ambulatory Visit
Admission: RE | Admit: 2019-03-19 | Discharge: 2019-03-19 | Disposition: A | Discharge status: Home / Self Care | Payer: 59 | Source: Ambulatory Visit | Attending: Radiation Oncology | Admitting: Radiation Oncology

## 2019-03-19 ENCOUNTER — Encounter: Payer: Self-pay | Admitting: Radiation Oncology

## 2019-03-19 ENCOUNTER — Encounter: Payer: Self-pay | Admitting: Medical Oncology

## 2019-03-19 DIAGNOSIS — Z510 Encounter for antineoplastic radiation therapy: Secondary | ICD-10-CM | POA: Diagnosis not present

## 2019-03-22 NOTE — Progress Notes (Signed)
  Radiation Oncology         [phone] ________________________________  Name: Cameron Lyons MRN: [id number]  Date: 03/19/2019  DOB: 12-29-1955  End of Treatment Note  Diagnosis:   63 y.o. 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     Indication for treatment:  Curative, Definitive Radiotherapy       Radiation treatment dates:   02/09/19-03/19/19  Site/dose:   The prostate was treated to 70 Gy in 28 fractions of 2.5 Gy  Beams/energy:   The patient was treated with IMRT using volumetric arc therapy delivering 6 MV X-rays to clockwise and counterclockwise circumferential arcs with a 90 degree collimator offset to avoid dose scalloping.  Image guidance was performed with daily cone beam CT prior to each fraction to align to gold markers in the prostate and assure proper bladder and rectal fill volumes.  Immobilization was achieved with BodyFix custom mold.  Narrative: The patient tolerated radiation treatment relatively well.   The patient experienced some minor urinary irritation and modest fatigue. Toward the end, he had tingling with urination. Denied hematuria. Reported nocturia is unchanged at 3-4. Reported urine stream seems weaker than it was when he started radiation therapy. Denied difficulty emptying his bladder or having to strain to void. Denied urinary leakage or incontinence. Reported bowel urgency and frequency. Patient denied a decline in his energy level since starting radiation therapy. Patient scheduled for a one month follow up with Freeman Caldron, PA on 04/16/19 at 1400  Plan: The patient has completed radiation treatment. He will return to radiation oncology clinic for routine followup in one month. I advised him to call or return sooner if he has any questions or concerns related to his recovery or treatment. ________________________________  Sheral Apley. Tammi Klippel, M.D.

## 2019-04-16 ENCOUNTER — Ambulatory Visit: Payer: 59 | Admitting: Urology

## 2019-04-30 ENCOUNTER — Ambulatory Visit
Admission: RE | Admit: 2019-04-30 | Discharge: 2019-04-30 | Disposition: A | Discharge status: Home / Self Care | Payer: 59 | Source: Ambulatory Visit | Attending: Urology | Admitting: Urology

## 2019-04-30 DIAGNOSIS — C61 Malignant neoplasm of prostate: Secondary | ICD-10-CM

## 2019-04-30 NOTE — Addendum Note (Signed)
Encounter addended by: Ross Marcus, RN on: 04/30/2019 2:17 PM  Actions taken: Medication taking status modified, Home Medications modified, Order list changed, Reconcile Outside Information medication data discarded, Outside historical medications filed, Order Reconciliation Section accessed, Medication List reviewed, Clinical Note Signed

## 2019-04-30 NOTE — Progress Notes (Signed)
Radiation Oncology         [phone] ________________________________  Name: Cameron Lyons MRN: [id number]  Date: 04/30/2019  DOB: Apr 28, 1956  Post Treatment Note  CC: No primary care provider on file.  Ramon Dredge, MD  Diagnosis:   63 y.o. 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     Interval Since Last Radiation:  6 weeks  02/09/19-03/19/19:  The prostate was treated to 70 Gy in 28 fractions of 2.5 Gy  Narrative:  I spoke with the patient to conduct his routine scheduled 1 month follow up visit via telephone to  spare the patient unnecessary potential exposure in the healthcare setting during the current COVID-19 pandemic.  The patient was notified in advance and gave permission to proceed with this visit format. He tolerated radiation treatment relatively well with minor urinary irritation and modest fatigue. Toward the end of treatment, he had tingling with urination, and weaker FOS but denied hematuria. He reported nocturia remained unchanged at 3-4/night.  He denied difficulty emptying his bladder or having to strain to void and denied urinary leakage or incontinence. He did experience some bowel urgency and frequency. He continued to tolerate ADT well.       On review of systems, the patient states that he is doing very well overall.  He reports almost complete resolution of his LUTS back to his baseline.  He specifically denies gross hematuria, dysuria, excessive daytime frequency, urgency, incomplete bladder emptying or incontinence.  He feels that he has a decent flow of stream and is emptying his bladder well on voiding.  He reports a healthy appetite and is maintaining his weight.  He denies abdominal pain, nausea, vomiting, diarrhea or constipation.  He has continued to tolerate the ADT very well despite periodic hot flashes but has not noticed any significant impact on his energy level.  Overall, he is quite  pleased with his progress to date.  ALLERGIES:  is allergic to codeine and dilaudid [hydromorphone hcl].  Meds: Current Outpatient Medications  Medication Sig Dispense Refill  . amLODipine (NORVASC) 10 MG tablet Take 10 mg by mouth daily.    Marland Kitchen aspirin 81 MG tablet Take by mouth.    . calcium citrate-vitamin D (CITRACAL+D) 315-200 MG-UNIT tablet Take by mouth.    . cloNIDine (CATAPRES) 0.3 MG tablet Take 0.3 mg by mouth 3 (three) times daily.    . hydrochlorothiazide (HYDRODIURIL) 25 MG tablet Take 25 mg by mouth daily.    Marland Kitchen lisinopril (ZESTRIL) 20 MG tablet Take by mouth.    . metFORMIN (GLUCOPHAGE-XR) 500 MG 24 hr tablet Take 1,000 mg by mouth 2 (two) times daily.    . metoprolol tartrate (LOPRESSOR) 25 MG tablet Take 25 mg by mouth 2 (two) times daily.    . mupirocin ointment (BACTROBAN) 2 % Apply to affected area twice daily. (Patient not taking: Reported on 11/04/2018) 15 g 0  . mycophenolate (CELLCEPT) 250 MG capsule Reports taking one tablet in the morning and one tablet at night.    Marland Kitchen omeprazole (PRILOSEC) 20 MG capsule Take 20 mg by mouth daily.    . tacrolimus (PROGRAF) 1 MG capsule TAKE 2 CAPSULES BY MOUTH EVERY MORNING AND 1 CAPSULE EVERY EVENING    . traMADol (ULTRAM) 50 MG tablet Take 50 mg by mouth every 6 (six) hours as needed. for pain     No current facility-administered medications for this encounter.  Physical Findings:  vitals were not taken for this visit.   /Unable to assess due to telephone follow up visit format.  Lab Findings: Lab Results  Component Value Date   WBC 10.2 04/06/2010   HGB 12.8 (L) 04/06/2010   HCT 33.7 (L) 04/06/2010   MCV 100.3 (H) 04/06/2010   PLT 120 (L) 04/06/2010     Radiographic Findings: No results found.  Impression/Plan: 1. 63 y.o. 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    He will continue to follow up with urology for ongoing PSA determinations and has an  appointment scheduled with Dr. Thomos Lemons at [facility] urology in December 2020. He understands what to expect with regards to PSA monitoring going forward. I will look forward to following his response to treatment via correspondence with urology, and would be happy to continue to participate in his care if clinically indicated. I talked to the patient about what to expect in the future, including his risk for erectile dysfunction and rectal bleeding. I encouraged him to call or return to the office if he has any questions regarding his previous radiation or possible radiation side effects. He was comfortable with this plan and will follow up as needed.    Nicholos Johns, PA-C

## 2019-04-30 NOTE — Patient Instructions (Signed)
Coronavirus (COVID-19) Are you at risk?  Are you at risk for the Coronavirus (COVID-19)?  To be considered HIGH RISK for Coronavirus (COVID-19), you have to meet the following criteria:  . Traveled to [place], [place], [place], [place] or [place]; or in the [place] to [place], [place], [place], or [place]; and have fever, cough, and shortness of breath within the last 2 weeks of travel OR . Been in close contact with a person diagnosed with COVID-19 within the last 2 weeks and have fever, cough, and shortness of breath . IF YOU DO NOT MEET THESE CRITERIA, YOU ARE CONSIDERED LOW RISK FOR COVID-19.  What to do if you are HIGH RISK for COVID-19?  . If you are having a medical emergency, call 911. . Seek medical care right away. Before you go to a doctor's office, urgent care or emergency department, call ahead and tell them about your recent travel, contact with someone diagnosed with COVID-19, and your symptoms. You should receive instructions from your physician's office regarding next steps of care.  . When you arrive at healthcare provider, tell the healthcare staff immediately you have returned from visiting [place], [place], [place], [place] or [place]; or traveled in the [place] to [place], [place], [place], or [place]; in the last two weeks or you have been in close contact with a person diagnosed with COVID-19 in the last 2 weeks.   . Tell the health care staff about your symptoms: fever, cough and shortness of breath. . After you have been seen by a medical provider, you will be either: o Tested for (COVID-19) and discharged home on quarantine except to seek medical care if symptoms worsen, and asked to  - Stay home and avoid contact with others until you get your results (4-5 days)  - Avoid travel on public transportation if possible (such as bus, train, or airplane) or o Sent to the Emergency Department by EMS for evaluation, COVID-19 testing, and possible  admission depending on your condition and test results.  What to do if you are LOW RISK for COVID-19?  Reduce your risk of any infection by using the same precautions used for avoiding the common cold or flu:  . Wash your hands often with soap and warm water for at least 20 seconds.  If soap and water are not readily available, use an alcohol-based hand sanitizer with at least 60% alcohol.  . If coughing or sneezing, cover your mouth and nose by coughing or sneezing into the elbow areas of your shirt or coat, into a tissue or into your sleeve (not your hands). . Avoid shaking hands with others and consider head nods or verbal greetings only. . Avoid touching your eyes, nose, or mouth with unwashed hands.  . Avoid close contact with people who are sick. . Avoid places or events with large numbers of people in one location, like concerts or sporting events. . Carefully consider travel plans you have or are making. . If you are planning any travel outside or inside the [place], visit the CDC's Travelers' Health webpage for the latest health notices. . If you have some symptoms but not all symptoms, continue to monitor at home and seek medical attention if your symptoms worsen. . If you are having a medical emergency, call 911.   ADDITIONAL HEALTHCARE OPTIONS FOR PATIENTS  [facility] / e-Visit: [email or web address]         [facility]: [phone]  [facility]   [facility]: [phone]                   [facility]: [phone]   

## 2019-08-17 ENCOUNTER — Other Ambulatory Visit: Payer: Self-pay

## 2019-08-17 DIAGNOSIS — C4492 Squamous cell carcinoma of skin, unspecified: Secondary | ICD-10-CM

## 2019-08-17 HISTORY — DX: Squamous cell carcinoma of skin, unspecified: C44.92

## 2019-09-15 ENCOUNTER — Encounter: Payer: Self-pay | Admitting: Physician Assistant

## 2019-09-15 ENCOUNTER — Other Ambulatory Visit: Payer: Self-pay

## 2019-09-15 ENCOUNTER — Ambulatory Visit (INDEPENDENT_AMBULATORY_CARE_PROVIDER_SITE_OTHER): Payer: 59 | Admitting: Physician Assistant

## 2019-09-15 DIAGNOSIS — L570 Actinic keratosis: Secondary | ICD-10-CM

## 2019-09-15 DIAGNOSIS — C44329 Squamous cell carcinoma of skin of other parts of face: Secondary | ICD-10-CM

## 2019-09-15 DIAGNOSIS — C4492 Squamous cell carcinoma of skin, unspecified: Secondary | ICD-10-CM

## 2019-09-15 DIAGNOSIS — Z85828 Personal history of other malignant neoplasm of skin: Secondary | ICD-10-CM | POA: Diagnosis not present

## 2019-09-15 DIAGNOSIS — C44320 Squamous cell carcinoma of skin of unspecified parts of face: Secondary | ICD-10-CM

## 2019-09-15 DIAGNOSIS — D044 Carcinoma in situ of skin of scalp and neck: Secondary | ICD-10-CM

## 2019-09-15 NOTE — Progress Notes (Addendum)
   Follow up Visit  Subjective  Cameron Lyons is a 64 y.o. male who presents for the following: Procedure (patient here today for treatment on top right scalp (CIS) patient has no new concerns today).  Objective  Well appearing patient in no apparent distress; mood and affect are within normal limits.  Face and scalp examined. Relevant physical exam findings are noted in the Assessment and Plan.  Objective  Left Ear: Erythematous patches with gritty scale.  Objective  Scalp: Scaly pink papule or plaque.   Objective  Right Parotid Area: Scar noted from previous biopsy.  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  Destruction of lesion Complexity: simple   Destruction method: electrodesiccation and curettage   Informed consent: discussed and consent obtained   Timeout:  patient name, date of birth, surgical site, and procedure verified Anesthesia: the lesion was anesthetized in a standard fashion   Anesthetic:  1% lidocaine w/ epinephrine 1-100,000 local infiltration Curettage performed in three different directions: Yes     Electrodesiccation performed over the curetted area: No   Lesion length (cm):  2.1 Lesion width (cm):  1 Margin per side (cm):  0 Final wound size (cm):  2.1 Hemostasis achieved with:  ferric subsulfate Outcome: patient tolerated procedure well with no complications   Additional details:  Wound innoculated with 5 fluorouracil solution.  We had recommended him to go to [facility] surgery for the right cheek SCC.  The patient refused to go and canceled that appt. At  his visit today he again refused to go to [facility]. I counseled him on Moderately differentiated SCC and the risk to him especially being on the side of the face. He states that he understands. He agreed to reconsider but at this time still does not want to be scheduled.   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 The documentation on 11/04/19 for the exam, diagnosis, procedures, and orders are all accurate and complete.

## 2019-11-09 NOTE — Addendum Note (Signed)
Addended by: Arlyss Gandy R on: 11/09/2019 10:33 AM   Modules accepted: Level of Service

## 2019-12-14 ENCOUNTER — Ambulatory Visit: Payer: 59 | Admitting: Physician Assistant

## 2021-04-25 ENCOUNTER — Telehealth: Payer: Self-pay

## 2021-04-25 NOTE — Telephone Encounter (Signed)
Patient called wanting to update Dr. Tammi Klippel on issue with scar tissue after past treatment.  Dr. Tammi Klippel was notified.  Nothing else follows.

## 2022-02-07 ENCOUNTER — Telehealth: Payer: Self-pay | Admitting: *Deleted

## 2022-02-07 NOTE — Telephone Encounter (Signed)
CALLED PATIENT TO ASK ABOUT COMING IN FOR A PSA ON 02-09-22, PATIENT AGREED TO COME ON 02-09-22 @ 10 AM

## 2022-02-08 ENCOUNTER — Telehealth: Payer: Self-pay | Admitting: *Deleted

## 2022-02-08 NOTE — Telephone Encounter (Signed)
CALLED PATIENT TO REMIND OF LAB FOR 02-09-22 @ 10 AM, SPOKE WITH PATIENT AND HE IS AWARE OF THIS APPT.

## 2022-02-09 ENCOUNTER — Ambulatory Visit
Admission: RE | Admit: 2022-02-09 | Discharge: 2022-02-09 | Disposition: A | Discharge status: Home / Self Care | Payer: 59 | Source: Ambulatory Visit | Attending: Radiation Oncology | Admitting: Radiation Oncology

## 2022-02-09 ENCOUNTER — Other Ambulatory Visit: Payer: Self-pay

## 2022-02-09 DIAGNOSIS — C61 Malignant neoplasm of prostate: Secondary | ICD-10-CM

## 2022-02-09 NOTE — Progress Notes (Signed)
Per orders.

## 2022-02-10 LAB — PROSTATE-SPECIFIC AG, SERUM (LABCORP): Prostate Specific Ag, Serum: 2 ng/mL (ref 0.0–4.0)

## 2022-02-16 ENCOUNTER — Encounter: Payer: Self-pay | Admitting: Urology

## 2022-02-16 ENCOUNTER — Other Ambulatory Visit: Payer: Self-pay

## 2022-02-16 ENCOUNTER — Ambulatory Visit
Admission: RE | Admit: 2022-02-16 | Discharge: 2022-02-16 | Disposition: A | Discharge status: Home / Self Care | Payer: 59 | Source: Ambulatory Visit | Attending: Urology | Admitting: Urology

## 2022-02-16 ENCOUNTER — Ambulatory Visit
Admission: RE | Admit: 2022-02-16 | Discharge: 2022-02-16 | Disposition: A | Discharge status: Home / Self Care | Payer: 59 | Source: Ambulatory Visit | Attending: Radiation Oncology | Admitting: Radiation Oncology

## 2022-02-16 VITALS — BP 160/83 | HR 69 | Temp 97.6°F | Resp 20 | Ht 68.0 in | Wt 158.8 lb

## 2022-02-16 DIAGNOSIS — Z79899 Other long term (current) drug therapy: Secondary | ICD-10-CM | POA: Diagnosis not present

## 2022-02-16 DIAGNOSIS — F17210 Nicotine dependence, cigarettes, uncomplicated: Secondary | ICD-10-CM | POA: Insufficient documentation

## 2022-02-16 DIAGNOSIS — Z79621 Long term (current) use of calcineurin inhibitor: Secondary | ICD-10-CM | POA: Insufficient documentation

## 2022-02-16 DIAGNOSIS — C679 Malignant neoplasm of bladder, unspecified: Secondary | ICD-10-CM

## 2022-02-16 DIAGNOSIS — Z7984 Long term (current) use of oral hypoglycemic drugs: Secondary | ICD-10-CM | POA: Insufficient documentation

## 2022-02-16 DIAGNOSIS — Z923 Personal history of irradiation: Secondary | ICD-10-CM | POA: Insufficient documentation

## 2022-02-16 DIAGNOSIS — C61 Malignant neoplasm of prostate: Secondary | ICD-10-CM | POA: Diagnosis present

## 2022-02-16 DIAGNOSIS — N99114 Postprocedural urethral stricture, male, unspecified: Secondary | ICD-10-CM

## 2022-02-16 DIAGNOSIS — Z8551 Personal history of malignant neoplasm of bladder: Secondary | ICD-10-CM | POA: Insufficient documentation

## 2022-02-16 DIAGNOSIS — Z85828 Personal history of other malignant neoplasm of skin: Secondary | ICD-10-CM | POA: Diagnosis not present

## 2022-02-16 NOTE — Progress Notes (Signed)
Follow-up-new appointment. I verified patient's identity and began nursing interview w/ spouse [name] in attendance. Patient is currently using a self-catheter daily and is experiencing some mild dysuria 2/10, therefore an I-PSS score has been included below. No other issues reported at this time.  Meaningful use complete. I-PSS score of 10, moderate. No urinary management medications. Urology appt- None  BP (!) 160/83 (BP Location: Left Arm, Patient Position: Sitting, Cuff Size: Normal)   Pulse 69   Temp 97.6 F (36.4 C) (Temporal)   Resp 20   Ht '5\' 8"'$  (1.727 m)   Wt 158 lb 12.8 oz (72 kg)   SpO2 99%   BMI 24.15 kg/m

## 2022-02-16 NOTE — Progress Notes (Signed)
Radiation Oncology         [phone] ________________________________  Follow Up New Note  Name: LEMONT Lyons MRN: [id number]  Date: 02/16/2022  DOB: [birth year]  WC:[id number], No Pcp Per  Cameron Lyons, Cameron Lyons,*   REFERRING PHYSICIAN: Polascik, Cameron Lyons,*  DIAGNOSIS: 66 y.o. 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    ICD-10-CM   1. Malignant neoplasm of prostate ([place])  C61       HISTORY OF PRESENT ILLNESS: Cameron Lyons is a 66 y.o. male with a history of prostate cancer and bladder cancer.  He is a longtime patient of Dr. 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 He was initially noted to have an elevated PSA of 17.8 in November 2019, with a palpable abnormality at the right apex on DRE 07/15/18.  Accordingly, he proceeded to transrectal ultrasound with 12 biopsies of the prostate on 08/25/18.  The prostate volume measured 11.4 cc.  Out of 12 core biopsies, 11 were positive.  The maximum Gleason score was 3+4. He was not felt to be a candidate for prostatectomy given his prior diagnosis and multiple surgical procedures for bladder cancer. We initially met the patient in consultation on 11/04/18 to discuss treatment options and he ultimately elected to proceed with 5-1/2 weeks of IMRT to the prostate, concurrent with ST-ADT.  He completed the course of radiation in September 2020.  At the time of his 1 month posttreatment follow-up on 04/30/2019, the patient was doing very well with improving LUTS and was released to follow-up with urology for PSA monitoring.    He has continued in routine follow-up with Dr. Thomos Lyons since that time for surveillance cystoscopy every 6 months but unbeknownst to [place], the PSA was not being  followed.  Fortunately, the PSA was being followed by his PCP at [facility] and he did have an excellent response to treatment with the PSA nadir at 1.2 on 12/08/2019.  However, since that time, there has been a very gradual rise in the PSA at 1.4 in February 2022, 1.8 in February 2023 and most recently at 2.0 in August 2023.  Regarding his bladder cancer, he has had 2 recurrences, in 05/2016 and 12/2020, which both showed showed pTa low-grade disease.  On cystoscopy in February 2018, he was noted to have a urethral lesion as well as a lesion at the left hemi-trigone. He was taken to the OR on 10/31/74 for an uncomplicated TURBT and pathology was negative.  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  TURBT was performed on 01/05/2021 with pathology confirming low-grade pTa disease.  On follow-up cystoscopy in October 2022, he was again noted to have a tight, bulbar urethral stricture that the scope could not bypass and therefore the patient was taken to the OR for flexible cystoscopy with urethral dilation and subsequently taught clean intermittent catheterization.  He did have a urethral dilation procedure to 20 French at the time of cystoscopy on 01/22/2022 but Dr. Thomos Lyons still did not feel like he could fully evaluate the bladder due to stenosis.  His most recent CT A/P from 01/15/22 was negative.  He presents to [place] today for follow-up of his prostate cancer and is requesting to transfer all of his urologic care here locally, in [place].  PREVIOUS RADIATION THERAPY: Yes 02/09/19 - 03/19/19:  The prostate was treated to 70 Gy in 28 fractions of 2.5 Gy, concurrent with ST-ADT.  PAST MEDICAL HISTORY:  Past Medical History:  Diagnosis Date   Bladder cancer ([place])    Hypertension    Prostate  cancer ([place])    SCC (squamous cell carcinoma) 08/17/2019   Top Right Scalp(in Situ)   SCC (squamous cell carcinoma) 08/17/2019   Right Cheek(well Diff)      PAST SURGICAL HISTORY: Past Surgical History:  Procedure Laterality Date   LIVER TRANSPLANT     NEPHRECTOMY     PROSTATE BIOPSY      FAMILY HISTORY:  Family History  Problem Relation Age of Onset   Head & neck cancer Maternal Grandmother     SOCIAL HISTORY:  Social History   Socioeconomic History   Marital status: Married    Spouse name: Not on file   Number of children: 1   Years of education: Not on file   Highest education level: Not on file  Occupational History   Not on file  Tobacco Use   Smoking status: Every Day    Packs/day: 0.25    Years: 57.00    Total pack years: 14.25    Types: Cigarettes   Smokeless tobacco: Never   Tobacco comments:    reports smoking approximately 3 cigarettes  Vaping Use   Vaping Use: Never used  Substance and Sexual Activity   Alcohol use: Not Currently   Drug use: Never   Sexual activity: Yes  Other Topics Concern   Not on file  Social History Narrative   Not on file   Social Determinants of Health   Financial Resource Strain: Not on file  Food Insecurity: Not on file  Transportation Needs: Not on file  Physical Activity: Not on file  Stress: Not on file  Social Connections: Not on file  Intimate Partner Violence: Not on file    ALLERGIES: Codeine and Dilaudid [hydromorphone hcl]  MEDICATIONS:  Current Outpatient Medications  Medication Sig Dispense Refill   amLODipine (NORVASC) 10 MG tablet Take 10 mg by mouth daily.     aspirin 81 MG tablet Take by mouth.     calcium citrate-vitamin D (CITRACAL+D) 315-200 MG-UNIT tablet Take by mouth.     cloNIDine (CATAPRES) 0.3 MG tablet Take 0.3 mg by mouth 3 (three) times daily.     hydrochlorothiazide (HYDRODIURIL) 25 MG tablet Take 25 mg by mouth daily.     lisinopril (ZESTRIL) 20 MG tablet Take by mouth.      metFORMIN (GLUCOPHAGE-XR) 500 MG 24 hr tablet Take 1,000 mg by mouth 2 (two) times daily.     metoprolol tartrate (LOPRESSOR) 25 MG tablet Take 25 mg by mouth 2 (two) times daily.     mupirocin ointment (BACTROBAN) 2 % Apply to affected area twice daily. (Patient not taking: Reported on 11/04/2018) 15 g 0   mycophenolate (CELLCEPT) 250 MG capsule Reports taking one tablet in the morning and one tablet at night.     omeprazole (PRILOSEC) 20 MG capsule Take 20 mg by mouth daily.     pravastatin (PRAVACHOL) 40 MG tablet Take by mouth.     tacrolimus (PROGRAF) 1 MG capsule TAKE 2 CAPSULES BY MOUTH EVERY MORNING AND 1  CAPSULE EVERY EVENING     traMADol (ULTRAM) 50 MG tablet Take 50 mg by mouth every 6 (six) hours as needed. for pain     No current facility-administered medications for this encounter.    REVIEW OF SYSTEMS:  On review of systems, the patient reports that he is doing well overall. He denies any chest pain, shortness of breath, cough, fevers, chills, night sweats, or unintended weight changes. He denies any bowel disturbances, and denies abdominal pain, nausea or vomiting. He reports constant mild pain at site of liver transplant, but denies any new musculoskeletal or joint aches or pains. His IPSS was 10, indicating mild urinary symptoms. He endorses using a catheter at home once daily in the mornings to empty his bladder and then is able to void freely on his own thereafter but does report occasional, mild dysuria with a good flow of stream and bladder emptying on voiding.  He specifically denies gross hematuria, straining to void, excessive daytime frequency, urgency or incontinence.  A complete review of systems is obtained and is otherwise negative.    PHYSICAL EXAM:  Wt Readings from Last 3 Encounters:  02/16/22 158 lb 12.8 oz (72 kg)  11/04/18 (P) 162 lb (73.5 kg)   Temp Readings from Last 3 Encounters:  02/16/22 97.6 F (36.4 C) (Temporal)  09/24/15 97.6 F (36.4 C) (Oral)    BP Readings from Last 3 Encounters:  02/16/22 (!) 160/83  09/24/15 177/100   Pulse Readings from Last 3 Encounters:  02/16/22 69  09/24/15 80   Pain Assessment Pain Score: 2  (Dysuria)/10  In general this is a well appearing Caucasian man in no acute distress. He's alert and oriented x4 and appropriate throughout the examination. Cardiopulmonary assessment is negative for acute distress and he exhibits normal effort.    LABORATORY DATA:  Lab Results  Component Value Date   WBC 10.2 04/06/2010   HGB 12.8 (L) 04/06/2010   HCT 33.7 (L) 04/06/2010   MCV 100.3 (H) 04/06/2010   PLT 120 (L) 04/06/2010   Lab Results  Component Value Date   NA 135 05/12/2010   K 3.4 (L) 05/12/2010   CL 99 05/12/2010   CO2 27 05/12/2010   Lab Results  Component Value Date   ALT 88 (H) 04/06/2010   AST 128 (H) 04/06/2010   ALKPHOS 118 (H) 04/06/2010   BILITOT 5.3 (H) 04/06/2010     RADIOGRAPHY: No results found.    IMPRESSION/PLAN: 1. 66 y.o. gentleman with Stage T2a adenocarcinoma of the prostate with Gleason Score of 3+4, and PSA of 17.8.  He appears to have recovered well from the effects of his previous external beam radiation for treatment of his prostate cancer.  The PSA remains quite low but is showing a very gradual rise so we will continue to monitor this closely with a repeat PSA in approximately 3 months.  He would like to establish care with a local urologist and we are happy to make a referral over to [facility]. We are going to repeat a PSA in 3 months and if the PSA continues an upward trend, we will consider further evaluation with PSMA PET imaging.  2. Urethral stricture.  This is likely a result of a combination of multiple urethral and bladder instrumentation  and procedures over the years for management of his bladder cancer in addition to his previous prostate radiation.  He has done well with urethral dilation in the past but it was recently suggested that he may  need  to consider more formal repair of the bladder neck contracture.  Again, he would like a second opinion to establish care and consolidate all of his urologic care here, locally in [place].  3. Low grade, T2a urothelial carcinoma. Initially diagnosed with bladder cancer in 2015 and has had 2 separate recurrences since that time, most recently in 12/2020, treated with TURBT. He will establish care with a local urologist here in [facility] for continued surveillance cystoscopies and imaging.    Nicholos Johns, PA-C    Tyler Pita, MD  [facility] Direct Dial: [phone]  Fax: [phone] [email or web address]  Skype  [email or web address]   This document serves as a record of services personally performed by Tyler Pita, MD and Freeman Caldron, PA-C. It was created on their behalf by Wilburn Mylar, a trained medical scribe. The creation of this record is based on the scribe's personal observations and the provider's statements to them. This document has been checked and approved by the attending provider.

## 2022-02-19 ENCOUNTER — Telehealth: Payer: Self-pay | Admitting: *Deleted

## 2022-02-19 NOTE — Telephone Encounter (Signed)
Called patient to inform of [place] visit with Dr. Abner Greenspan of [facility] on 03-01-22- arrival time- 8 am, spoke with patient and he is aware of this appt.

## 2022-05-31 ENCOUNTER — Inpatient Hospital Stay: Payer: 59 | Attending: Radiation Oncology

## 2022-05-31 DIAGNOSIS — C61 Malignant neoplasm of prostate: Secondary | ICD-10-CM | POA: Insufficient documentation

## 2022-06-01 LAB — PROSTATE-SPECIFIC AG, SERUM (LABCORP): Prostate Specific Ag, Serum: 2.1 ng/mL (ref 0.0–4.0)

## 2023-05-02 ENCOUNTER — Other Ambulatory Visit (HOSPITAL_COMMUNITY): Payer: Self-pay | Admitting: Urology

## 2023-05-02 DIAGNOSIS — C61 Malignant neoplasm of prostate: Secondary | ICD-10-CM

## 2023-05-20 ENCOUNTER — Encounter (HOSPITAL_COMMUNITY)
Admission: RE | Admit: 2023-05-20 | Discharge: 2023-05-20 | Disposition: A | Discharge status: Home / Self Care | Payer: 59 | Source: Ambulatory Visit | Attending: Urology | Admitting: Urology

## 2023-05-20 DIAGNOSIS — C61 Malignant neoplasm of prostate: Secondary | ICD-10-CM | POA: Diagnosis present

## 2023-05-20 MED ORDER — FLOTUFOLASTAT F 18 GALLIUM 296-5846 MBQ/ML IV SOLN
8.4300 | Freq: Once | INTRAVENOUS | Status: AC
  Administered 2023-05-20: 8.43 via INTRAVENOUS

## 2023-10-31 DEATH — deceased

## 2023-12-20 ENCOUNTER — Other Ambulatory Visit (HOSPITAL_COMMUNITY): Payer: Self-pay | Admitting: Urology

## 2023-12-20 DIAGNOSIS — C61 Malignant neoplasm of prostate: Secondary | ICD-10-CM
